# Patient Record
Sex: Female | Born: 1990 | Race: Black or African American | Hispanic: No | Marital: Single | Smoking: Never smoker
Health system: Southern US, Community
[De-identification: ages and names within clinical notes are randomized; demographics above are authoritative.]

## PROBLEM LIST (undated history)

## (undated) DIAGNOSIS — Z789 Other specified health status: Secondary | ICD-10-CM

## (undated) HISTORY — PX: NO PAST SURGERIES: SHX2092

---

## 2012-01-12 ENCOUNTER — Encounter (HOSPITAL_COMMUNITY): Payer: Self-pay

## 2012-01-12 ENCOUNTER — Emergency Department (HOSPITAL_COMMUNITY)
Admission: EM | Admit: 2012-01-12 | Discharge: 2012-01-12 | Disposition: A | Discharge status: Home / Self Care | Payer: Managed Care, Other (non HMO) | Attending: Emergency Medicine | Admitting: Emergency Medicine

## 2012-01-12 DIAGNOSIS — J029 Acute pharyngitis, unspecified: Secondary | ICD-10-CM

## 2012-01-12 LAB — RAPID STREP SCREEN (MED CTR MEBANE ONLY): Streptococcus, Group A Screen (Direct): NEGATIVE

## 2012-01-12 MED ORDER — LIDOCAINE HCL (PF) 1 % IJ SOLN
INTRAMUSCULAR | Status: AC
  Administered 2012-01-12: 2 mL
  Filled 2012-01-12: qty 5

## 2012-01-12 MED ORDER — ERYTHROMYCIN BASE 500 MG PO TABS: 500.0000 mg | ORAL_TABLET | Freq: Four times a day (QID) | ORAL | Status: DC

## 2012-01-12 MED ORDER — CEFTRIAXONE SODIUM 250 MG IJ SOLR
250.0000 mg | Freq: Once | INTRAMUSCULAR | Status: AC
  Administered 2012-01-12: 250 mg via INTRAMUSCULAR
  Filled 2012-01-12: qty 250

## 2012-01-12 NOTE — ED Notes (Signed)
sts sore throat, took pcn by ucc and no relief.

## 2012-01-12 NOTE — ED Provider Notes (Signed)
History  This chart was scribed for Ward Givens, MD by Erskine Emery. This patient was seen in room TR02C/TR02C and the patient's care was started at 15:31.   CSN: [id number]  Arrival date & time 01/12/12  1404   First MD Initiated Contact with Patient 01/12/12 1531      Chief Complaint  Patient presents with  . Sore Throat    (Consider location/radiation/quality/duration/timing/severity/associated sxs/prior treatment) HPI  Judith Craig is a 21 y.o. female who presents to the Emergency Department complaining of a mild constant sore throat. Pt reports she went to urgent care 2 weeks ago where she was tested for strep with negative findings. Pt reports she took penicillin after that visit with no relief from symptoms. She finished her PCN yesterday.  Pt denies any swelling in neck, fevers,myalgias, chills, or fatigue. Pt reports some mild dysphagia but is able to eat.   PCP none  No past medical history on file.  No past surgical history on file.  No family history on file.  History  Substance Use Topics  . Smoking status: Never Smoker   . Smokeless tobacco: Not on file  . 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   Review of Systems  Constitutional: Negative for fever, chills and fatigue.  HENT: Positive for sore throat. Negative for neck pain.   Respiratory: Negative for shortness of breath.   Gastrointestinal: Negative for nausea and vomiting.  Neurological: Negative for weakness.    Allergies  Review of patient's allergies indicates no known allergies.  Home Medications  Finished PEN VK Yesterday   BP 113/66  Pulse 85  Temp 98.4 F (36.9 C) (Oral)  Resp 18  SpO2 97% Vital signs normal   Physical Exam  Nursing note and vitals reviewed. Constitutional: She is oriented to person, place, and time. She appears well-developed and  well-nourished. No distress.  HENT:  Head: Normocephalic and atraumatic.  Mouth/Throat: Oropharyngeal exudate present.       Tonsils enlarged with erythema and some purulent exudate. No soft pallet swelling. Speech is normal.   Eyes: Conjunctivae and EOM are normal. Pupils are equal, round, and reactive to light.  Neck: Normal range of motion. Neck supple. No tracheal deviation present.       Mild lymphadenopathy on the left.   Cardiovascular: Normal rate, regular rhythm and normal heart sounds.   Pulmonary/Chest: Effort normal and breath sounds normal. No respiratory distress.  Musculoskeletal: Normal range of motion.       No epitrochlear nodes.   Lymphadenopathy:    She has cervical adenopathy.  Neurological: She is alert and oriented to person, place, and time.  Skin: Skin is warm and dry.  Psychiatric: She has a normal mood and affect. Her behavior is normal.    ED Course  Procedures (including critical care time)   Medications  cefTRIAXone (ROCEPHIN) injection 250 mg (not administered)  lidocaine (XYLOCAINE) 1 % injection (not administered)   Pt states the last time she performed oral sex was about 3 weeks ago.  She will be treated for  ? GC or chlymydia as etiology of her sore throat.   DIAGNOSTIC STUDIES: Oxygen Saturation is 97% on room air, adequate by my interpretation.    COORDINATION OF CARE: 15:30--I discussed treatment  plan including with pt and pt agreed.   Results for orders placed during the hospital encounter of 01/12/12  RAPID STREP SCREEN      Component Value Range   Streptococcus, Group A Screen (Direct) NEGATIVE  NEGATIVE  MONONUCLEOSIS SCREEN      Component Value Range   Mono Screen NEGATIVE  NEGATIVE     1. Pharyngitis     New Prescriptions   ERYTHROMYCIN BASE (E-MYCIN) 500 MG TABLET    Take 1 tablet (500 mg total) by mouth 4 (four) times daily.    Plan discharge  Devoria Albe, MD, FACEP   MDM   I personally performed the services  described in this documentation, which was scribed in my presence. The recorded information has been reviewed and considered.  Devoria Albe, MD, Armando Gang          Ward Givens, MD 01/12/12 2040

## 2012-01-14 ENCOUNTER — Encounter (HOSPITAL_COMMUNITY): Payer: Self-pay | Admitting: Physical Medicine and Rehabilitation

## 2012-01-14 ENCOUNTER — Emergency Department (HOSPITAL_COMMUNITY)
Admission: EM | Admit: 2012-01-14 | Discharge: 2012-01-14 | Disposition: A | Discharge status: Home / Self Care | Payer: Managed Care, Other (non HMO) | Attending: Emergency Medicine | Admitting: Emergency Medicine

## 2012-01-14 DIAGNOSIS — R110 Nausea: Secondary | ICD-10-CM | POA: Insufficient documentation

## 2012-01-14 NOTE — ED Notes (Signed)
Pt presents to department for evaluation of possible medication reaction. Pt states she was seen here on 01/12/12 and prescribed erythromycin. Now states she is very nauseated after taking medication. No other complaints at the time. She is alert and oriented x4. No signs of distress noted.

## 2012-01-15 ENCOUNTER — Emergency Department (INDEPENDENT_AMBULATORY_CARE_PROVIDER_SITE_OTHER)
Admission: EM | Admit: 2012-01-15 | Discharge: 2012-01-15 | Disposition: A | Discharge status: Home / Self Care | Payer: PRIVATE HEALTH INSURANCE | Source: Home / Self Care | Attending: Emergency Medicine | Admitting: Emergency Medicine

## 2012-01-15 ENCOUNTER — Encounter (HOSPITAL_COMMUNITY): Payer: Self-pay

## 2012-01-15 DIAGNOSIS — J039 Acute tonsillitis, unspecified: Secondary | ICD-10-CM

## 2012-01-15 MED ORDER — DOXYCYCLINE HYCLATE 100 MG PO CAPS: 100.0000 mg | ORAL_CAPSULE | Freq: Two times a day (BID) | ORAL | Status: AC

## 2012-01-15 NOTE — ED Provider Notes (Signed)
History     CSN: [id number]  Arrival date & time 01/15/12  1342   First MD Initiated Contact with Patient 01/15/12 1658      Chief Complaint  Patient presents with  . Medication Reaction    (Consider location/radiation/quality/duration/timing/severity/associated sxs/prior treatment) HPI Comments: This is patient's third visit related to long: Tonsillitis. Initially she was seen in [facility] as her description she was screened for strep in her test results yielded negative results, provider still decided to treat her with a penicillin course. After 10 days patient was not fully back to normal as she was still expressing a sore throat and discomfort with swelling and redness of her throat. Patient then went to our emergency department where she was screened also for mononucleosis and a second strep test was done which yielded negative results. She was treated and started on erythromycin. Which she has been taking with no improvement of her sore throat but it's also making her feel nauseous. Patient describes discomfort with swallowing foods or foods but is able to swallow without difficulty and has not been expressing any fevers, generalized malaise, body aches, nausea or vomiting or abdominal pain.  The history is provided by the patient.    History reviewed. No pertinent past medical history.  History reviewed. No pertinent past surgical history.  History reviewed. No pertinent family history.  History  Substance Use Topics  . Smoking status: Never Smoker   . Smokeless tobacco: Not on file  . Alcohol Use: Yes     social    OB History    Grav Para Term Preterm Abortions TAB SAB Ect Mult Living                  Review of Systems  Constitutional: Negative for fever, chills, appetite change and fatigue.  HENT: Positive for sore throat. Negative for facial swelling, trouble swallowing, neck pain, neck stiffness, dental problem, voice change and sinus pressure.     Respiratory: Negative for cough, shortness of breath and wheezing.   Genitourinary: Negative for dysuria.  Musculoskeletal: Negative for myalgias, joint swelling and arthralgias.  Skin: Negative for rash and wound.    Allergies  Review of patient's allergies indicates no known allergies.  Home Medications   Current Outpatient Rx  Name Route Sig Dispense Refill  . DOXYCYCLINE HYCLATE 100 MG PO CAPS Oral Take 1 capsule (100 mg total) by mouth 2 (two) times daily. 20 capsule 0    BP 114/64  Pulse 64  Temp 98.3 F (36.8 C) (Oral)  Resp 16  SpO2 100%  LMP 12/24/2011  Physical Exam  Nursing note and vitals reviewed. Constitutional: She appears well-developed and well-nourished.  HENT:  Head: Normocephalic.  Mouth/Throat: Uvula is midline and mucous membranes are normal. Oropharyngeal exudate and posterior oropharyngeal erythema present. No tonsillar abscesses.  Eyes: Conjunctivae are normal.  Neck: Normal range of motion. Neck supple. No JVD present.  Pulmonary/Chest: Effort normal and breath sounds normal.  Abdominal: Soft.  Lymphadenopathy:    She has cervical adenopathy.  Skin: No rash noted. No erythema.    ED Course  Procedures (including critical care time)       MDM  Ongoing exudative tonsillitis. Today to obtain a sample for a throat culture. DNA probe. Patient had been screened for strep, and mononucleosis which both test results yielded negative results. Patient is afebrile without signs of a pharyngeal or peritonsillar abscess. As patient is experiencing nausea and gastritis secondary to erythromycin. She has also  completed a penicillin course prescribed to her by another urgent care prior to her last ED visit. Will change antibiotic class doxycycline, have encouraged patient to take this tablets twice a day with foods. No significant improvement have been instructed to return in 3 days for recheck. She agreed to a photograph of her tonsils for record keeping  the comparison. Patient otherwise feels fine he is in no distress mild discomfort with swallowing and eating and drinking fluids fine        Jimmie Molly, MD 01/15/12 1742

## 2012-01-15 NOTE — ED Notes (Signed)
Pt c/o adverse effects from Rx Erythromycin- nausea. Rx from [facility] on 7/19 for sore throat. Pt states that Erythromycin is relieving sym but causing nausea. Prior to this Rx she had course of Penicillin which did not relieve her sym.

## 2012-01-16 LAB — STREP A DNA PROBE

## 2012-02-06 ENCOUNTER — Encounter (HOSPITAL_COMMUNITY): Payer: Self-pay | Admitting: Emergency Medicine

## 2012-02-06 ENCOUNTER — Emergency Department (HOSPITAL_COMMUNITY)
Admission: EM | Admit: 2012-02-06 | Discharge: 2012-02-06 | Disposition: A | Discharge status: Home / Self Care | Payer: Managed Care, Other (non HMO) | Attending: Emergency Medicine | Admitting: Emergency Medicine

## 2012-02-06 DIAGNOSIS — B3731 Acute candidiasis of vulva and vagina: Secondary | ICD-10-CM | POA: Insufficient documentation

## 2012-02-06 DIAGNOSIS — B373 Candidiasis of vulva and vagina: Secondary | ICD-10-CM

## 2012-02-06 LAB — WET PREP, GENITAL: Trich, Wet Prep: NONE SEEN

## 2012-02-06 LAB — GC/CHLAMYDIA PROBE AMP, GENITAL: GC Probe Amp, Genital: NEGATIVE

## 2012-02-06 MED ORDER — FLUCONAZOLE 150 MG PO TABS
150.0000 mg | ORAL_TABLET | Freq: Once | ORAL | Status: AC
  Administered 2012-02-06: 150 mg via ORAL
  Filled 2012-02-06: qty 1

## 2012-02-06 NOTE — ED Notes (Signed)
Pt states her labia is swollen on the right side  Denies injury  Denies any change of soap, shaving cream, etc.

## 2012-02-06 NOTE — ED Provider Notes (Signed)
History     CSN: [id number]  Arrival date & time 02/06/12  0245   First MD Initiated Contact with Patient 02/06/12 [phone]      Chief Complaint  Patient presents with  . Groin Swelling    (Consider location/radiation/quality/duration/timing/severity/associated sxs/prior treatment) HPI 21 yo female presents to the ER with c/o right labial swelling.  Pt denies pain, itching.  No prior h/o same.  No new sexual partners, no discharge.  Pt reports placing acidophilus tablet in her vagina 3 days ago.    History reviewed. No pertinent past medical history.  History reviewed. No pertinent past surgical history.  Family History  Problem Relation Age of Onset  . Hypertension Other   . Diabetes Other     History  Substance Use Topics  . Smoking status: Never Smoker   . Smokeless tobacco: Not on file  . Alcohol Use: Yes     social    OB History    Grav Para Term Preterm Abortions TAB SAB Ect Mult Living                  Review of Systems  All other systems reviewed and are negative.    Allergies  Review of patient's allergies indicates no known allergies.  Home Medications   Current Outpatient Rx  Name Route Sig Dispense Refill  . RISAQUAD PO CAPS Oral Take 1 capsule by mouth daily.    Marland Kitchen FLUCONAZOLE 150 MG PO TABS Oral Take 150 mg by mouth once.      BP 107/67  Pulse 79  Temp 98.5 F (36.9 C) (Oral)  Resp 20  SpO2 100%  LMP 12/24/2011  Physical Exam  Nursing note and vitals reviewed. Constitutional: She appears well-developed and well-nourished. No distress.  HENT:  Head: Normocephalic and atraumatic.  Neck: Normal range of motion. Neck supple. No JVD present. No tracheal deviation present. No thyromegaly present.  Cardiovascular: Normal rate, regular rhythm, normal heart sounds and intact distal pulses.  Exam reveals no gallop and no friction rub.   No murmur heard. Pulmonary/Chest: Effort normal and breath sounds normal. No stridor. No respiratory  distress. She has no wheezes. She has no rales. She exhibits no tenderness.  Abdominal: Soft. Bowel sounds are normal. She exhibits no distension and no mass. There is no tenderness. There is no rebound and no guarding.  Genitourinary:       Thick white discharge noted.  Labia normal, right slightly larger than left, no abscess, swelling induration noted  Musculoskeletal: Normal range of motion. She exhibits no edema and no tenderness.  Lymphadenopathy:    She has no cervical adenopathy.  Skin: Skin is warm and dry. No rash noted. She is not diaphoretic. No erythema. No pallor.    ED Course  Procedures (including critical care time)  Labs Reviewed  WET PREP, GENITAL - Abnormal; Notable for the following:    Yeast Wet Prep HPF POC FEW (*)     Clue Cells Wet Prep HPF POC RARE (*)     WBC, Wet Prep HPF POC TOO NUMEROUS TO COUNT (*)     All other components within normal limits  GC/CHLAMYDIA PROBE AMP, GENITAL    1. Vaginal yeast infection       MDM  21 yo female with reported labial swelling, no acute infection or inflammation at this time.  Yeast infection noted.  Will treat with diflucan        Olivia Mackie, MD 02/07/12 [phone]

## 2014-09-04 ENCOUNTER — Ambulatory Visit (INDEPENDENT_AMBULATORY_CARE_PROVIDER_SITE_OTHER): Payer: 59 | Admitting: Physician Assistant

## 2014-09-04 VITALS — BP 120/80 | HR 89 | Temp 98.1°F | Resp 16 | Ht 64.5 in | Wt 137.0 lb

## 2014-09-04 DIAGNOSIS — Z111 Encounter for screening for respiratory tuberculosis: Secondary | ICD-10-CM | POA: Diagnosis not present

## 2014-09-04 DIAGNOSIS — Z23 Encounter for immunization: Secondary | ICD-10-CM

## 2014-09-04 NOTE — Progress Notes (Signed)
   09/04/2014 at 5:59 PM  Newman Pies / DOB: May 11, 1991 / MRN: [id number]  The patient  does not have a problem list on file.  SUBJECTIVE  Chief compalaint: Immunizations   History of present illness: Ms. Hausner is 24 y.o. well appearing female presenting for a hep B booster, and for a Tb gold assay.  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    She  has no past medical history on file.    She has a current medication list which includes the following prescription(s): acidophilus.  Ms. Cifuentes has No Known Allergies. She  reports that she has never smoked. She does not have any smokeless tobacco history on file. She reports that she drinks alcohol. She reports that she does not use illicit drugs. She  reports that she currently engages in sexual activity. She reports using the following method of birth control/protection: None. The patient  has no past surgical history on file.  Her family history includes Diabetes in her other and paternal grandmother; Hypertension in her other.  Review of Systems  Constitutional: Negative.     OBJECTIVE  Her  height is 5' 4.5" (1.638 m) and weight is 137 lb (62.143 kg). Her oral temperature is 98.1 F (36.7 C). Her blood pressure is 120/80 and her pulse is 89. Her respiration is 16 and oxygen saturation is 100%.  The patient's body mass index is 23.16 kg/(m^2).  Physical Exam  Constitutional: She appears well-developed and well-nourished.    No results found for this or any previous visit (from the past 24 hour(s)).  ASSESSMENT & PLAN  Elza was seen today for immunizations.  Diagnoses and all orders for this visit:  Need for vaccination against hepatitis B virus Orders: -     Hepatitis B vaccine adult IM  Tuberculosis screening Orders: -     Quantiferon tb gold assay (blood)   The patient was advised to call or come back to clinic if she does not see an improvement in symptoms, or worsens with the above  plan.   Philis Fendt, MHS, PA-C [facility] 09/04/2014 5:59 PM

## 2014-09-09 LAB — QUANTIFERON TB GOLD ASSAY (BLOOD)
Interferon Gamma Release Assay: NEGATIVE
Mitogen value: >10 IU/mL
Quantiferon Nil Value: 0.06 IU/mL
Quantiferon Tb Ag Minus Nil Value: 0 IU/mL
TB AG VALUE: 0.06 [IU]/mL

## 2015-04-26 ENCOUNTER — Ambulatory Visit (INDEPENDENT_AMBULATORY_CARE_PROVIDER_SITE_OTHER): Payer: 59 | Admitting: Family Medicine

## 2015-04-26 VITALS — BP 114/70 | HR 67 | Temp 98.3°F | Resp 16 | Ht 63.5 in | Wt 135.2 lb

## 2015-04-26 DIAGNOSIS — Z0000 Encounter for general adult medical examination without abnormal findings: Secondary | ICD-10-CM

## 2015-04-26 DIAGNOSIS — Z2821 Immunization not carried out because of patient refusal: Secondary | ICD-10-CM | POA: Diagnosis not present

## 2015-04-26 NOTE — Progress Notes (Signed)
Chief Complaint:  Chief Complaint  Patient presents with  . Annual Exam    HPI: Judith Craig is a 23 y.o. female who reports to 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  She does not have her records on her. She thinks she has it from [facility]. TB questions were negative  UTD on vaccines and pap No breast or vaginal sxs Can do all the functions of being a teacher, denies any SI/HI/halluciantions    History reviewed. No pertinent past medical history. History reviewed. No pertinent past surgical history. Social History   Social History  . Marital Status: Single    Spouse Name: N/A  . Number of Children: N/A  . Years of Education: N/A   Occupational History  . TEACHER    Social History Main Topics  . Smoking status: Never Smoker   . Smokeless tobacco: None  . Alcohol Use: No     Comment: social  . Drug Use: No  . Sexual Activity: Yes    Birth Control/ Protection: None   Other Topics Concern  . None   Social History Narrative   Family History  Problem Relation Age of Onset  . Hypertension Other   . Diabetes Other   . Diabetes Paternal Grandmother    No Known Allergies Prior to Admission medications   Medication Sig Start Date End Date Taking? Authorizing Provider  acidophilus (RISAQUAD) CAPS Take 1 capsule by mouth daily.    Historical Provider, MD     ROS: The patient denies fevers, chills, night sweats, unintentional weight loss, chest pain, palpitations, wheezing, dyspnea on exertion, nausea, vomiting, abdominal pain, dysuria, hematuria, melena, numbness, weakness, or tingling.   All other systems have been reviewed and were otherwise negative with the exception of those mentioned in the HPI and as above.    PHYSICAL EXAM: Filed Vitals:   04/26/15 1813  BP: 114/70  Pulse: 67  Temp: 98.3 F (36.8 C)  Resp: 16   Body mass index is  23.58 kg/(m^2).   General: Alert, no acute distress HEENT:  Normocephalic, atraumatic, oropharynx patent. EOMI, PERRLA, no thyroidmegaly Cardiovascular:  Regular rate and rhythm, no rubs murmurs or gallops.  No Carotid bruits, radial pulse intact. No pedal edema.  Respiratory: Clear to auscultation bilaterally.  No wheezes, rales, or rhonchi.  No cyanosis, no use of accessory musculature Abdominal: No organomegaly, abdomen is soft and non-tender, positive bowel sounds. No masses. Skin: No rashes. Neurologic: Facial musculature symmetric. Psychiatric: Patient acts appropriately throughout our interaction. Lymphatic: No cervical or submandibular lymphadenopathy Musculoskeletal: Gait intact. No edema, tenderness Neg scoliosis, 5/5 strength UE and Shellee Streng   LABS: Results for orders placed or performed in visit on 09/04/14  Quantiferon tb gold assay (blood)  Result Value Ref Range   Interferon Gamma Release Assay NEGATIVE NEGATIVE   TB Ag value 0.06 IU/mL   Quantiferon Nil Value 0.06 IU/mL   Mitogen value >10.00 IU/mL   Quantiferon Tb Ag Minus Nil Value 0.00 IU/mL     EKG/XRAY:   Primary read interpreted by Dr. Marin Comment at [facility].   ASSESSMENT/PLAN: Encounter Diagnoses  Name Primary?  . Annual physical exam Yes  . Influenza vaccination declined    Nectar vaccine DB pulled, she is utd on her vaccines  She was given her forms She declines flu vaccine and nay labs, recent pap was in January and  was normal Fu prn   Gross sideeffects, risk and benefits, and alternatives of medications d/w patient. Patient is aware that all medications have potential sideeffects and we are unable to predict every sideeffect or drug-drug interaction that may occur.  Umeka Wrench DO  04/27/2015 1:14 PM

## 2019-04-18 ENCOUNTER — Other Ambulatory Visit: Payer: Self-pay

## 2019-04-18 ENCOUNTER — Encounter (HOSPITAL_COMMUNITY): Payer: Self-pay | Admitting: Emergency Medicine

## 2019-04-18 ENCOUNTER — Emergency Department (HOSPITAL_COMMUNITY)
Admission: EM | Admit: 2019-04-18 | Discharge: 2019-04-19 | Disposition: A | Discharge status: Home / Self Care | Attending: Emergency Medicine | Admitting: Emergency Medicine

## 2019-04-18 DIAGNOSIS — Z79899 Other long term (current) drug therapy: Secondary | ICD-10-CM | POA: Diagnosis not present

## 2019-04-18 DIAGNOSIS — N839 Noninflammatory disorder of ovary, fallopian tube and broad ligament, unspecified: Secondary | ICD-10-CM | POA: Insufficient documentation

## 2019-04-18 DIAGNOSIS — R140 Abdominal distension (gaseous): Secondary | ICD-10-CM | POA: Diagnosis present

## 2019-04-18 DIAGNOSIS — N838 Other noninflammatory disorders of ovary, fallopian tube and broad ligament: Secondary | ICD-10-CM

## 2019-04-18 LAB — URINALYSIS, ROUTINE W REFLEX MICROSCOPIC
Bilirubin Urine: NEGATIVE
Glucose, UA: NEGATIVE mg/dL
Ketones, ur: NEGATIVE mg/dL
Nitrite: NEGATIVE
Protein, ur: NEGATIVE mg/dL
Specific Gravity, Urine: 1.019 (ref 1.005–1.030)
pH: 5 (ref 5.0–8.0)

## 2019-04-18 LAB — I-STAT BETA HCG BLOOD, ED (MC, WL, AP ONLY): I-stat hCG, quantitative: <5 m[IU]/mL (ref <5)

## 2019-04-18 LAB — COMPREHENSIVE METABOLIC PANEL
ALT: 22 U/L (ref 0–44)
AST: 27 U/L (ref 15–41)
Albumin: 3.6 g/dL (ref 3.5–5.0)
Alkaline Phosphatase: 59 U/L (ref 38–126)
Anion gap: 13 (ref 5–15)
BUN: 5 mg/dL — ABNORMAL LOW (ref 6–20)
CO2: 22 mmol/L (ref 22–32)
Calcium: 9.5 mg/dL (ref 8.9–10.3)
Chloride: 104 mmol/L (ref 98–111)
Creatinine, Ser: 0.79 mg/dL (ref 0.44–1.00)
GFR calc Af Amer: >60 mL/min (ref >60)
GFR calc non Af Amer: >60 mL/min (ref >60)
Glucose, Bld: 81 mg/dL (ref 70–99)
Potassium: 4.2 mmol/L (ref 3.5–5.1)
Sodium: 139 mmol/L (ref 135–145)
Total Bilirubin: 0.6 mg/dL (ref 0.3–1.2)
Total Protein: 7.1 g/dL (ref 6.5–8.1)

## 2019-04-18 LAB — CBC
HCT: 41.9 % (ref 36.0–46.0)
Hemoglobin: 13.7 g/dL (ref 12.0–15.0)
MCH: 30.5 pg (ref 26.0–34.0)
MCHC: 32.7 g/dL (ref 30.0–36.0)
MCV: 93.3 fL (ref 80.0–100.0)
Platelets: 450 10*3/uL — ABNORMAL HIGH (ref 150–400)
RBC: 4.49 MIL/uL (ref 3.87–5.11)
RDW: 12.7 % (ref 11.5–15.5)
WBC: 6.6 10*3/uL (ref 4.0–10.5)
nRBC: 0 % (ref 0.0–0.2)

## 2019-04-18 LAB — LIPASE, BLOOD: Lipase: 24 U/L (ref 11–51)

## 2019-04-18 MED ORDER — SODIUM CHLORIDE 0.9% FLUSH: 3.0000 mL | Freq: Once | INTRAVENOUS | Status: DC

## 2019-04-18 NOTE — ED Triage Notes (Signed)
Pt c/o abd pain and distention. Pt seen at [facility] for same, given meds for constipation, no relief. Last BM 8 days ago. Denies n/v

## 2019-04-19 ENCOUNTER — Emergency Department (HOSPITAL_COMMUNITY)

## 2019-04-19 MED ORDER — IOHEXOL 300 MG/ML  SOLN
100.0000 mL | Freq: Once | INTRAMUSCULAR | Status: AC | PRN
  Administered 2019-04-19: 04:00:00 100 mL via INTRAVENOUS

## 2019-04-19 NOTE — Discharge Instructions (Addendum)
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  You will need to have surgery.  The surgery is to take place urgently.  You should NOT travel, relocate or deploy prior to having surgery.

## 2019-04-19 NOTE — ED Provider Notes (Signed)
[facility] EMERGENCY DEPARTMENT Provider Note   CSN: [id number] Arrival date & time: 04/18/19  1919     History   Chief Complaint Chief Complaint  Patient presents with  . Abdominal Pain    HPI Judith Craig is a 28 y.o. female.     Patient presents to the emergency department with a chief complaint of abdominal distention and constipation.  She states that her belly has become very distended over the past couple of weeks.  She denies having any recent bowel movements.  States that her last bowel movement was on 10/15.  She denies pain.  Denies any fevers, chills, nausea, vomiting.  She states that she has been taking MiraLAX and Colace without any success for the past 2 weeks.  The history is provided by the patient. No language interpreter was used.    History reviewed. No pertinent past medical history.  There are no active problems to display for this patient.   History reviewed. No pertinent surgical history.   OB History   No obstetric history on file.      Home Medications    Prior to Admission medications   Medication Sig Start Date End Date Taking? Authorizing Provider  acidophilus (RISAQUAD) CAPS Take 1 capsule by mouth daily.    [provider]    Family History Family History  Problem Relation Age of Onset  . Diabetes Paternal Grandmother   . Hypertension Other   . Diabetes Other     Social History Social History   Tobacco Use  . Smoking status: Never Smoker  . Smokeless tobacco: Never Used  Substance Use Topics  . Alcohol use: No    Alcohol/week: 0.0 standard drinks    Comment: social  . Drug use: No     Allergies   Patient has no known allergies.   Review of Systems Review of Systems  All other systems reviewed and are negative.    Physical Exam Updated Vital Signs BP 136/86 (BP Location: Left Arm)   Pulse 93   Temp 98.7 F (37.1 C) (Oral)   Resp 18   Ht 5\' 4"  (1.626 m)   Wt 61 kg   LMP  04/15/2019   SpO2 94%   BMI 23.08 kg/m   Physical Exam Vitals signs and nursing note reviewed.  Constitutional:      General: She is not in acute distress.    Appearance: She is well-developed.  HENT:     Head: Normocephalic and atraumatic.  Eyes:     Conjunctiva/sclera: Conjunctivae normal.  Neck:     Musculoskeletal: Neck supple.  Cardiovascular:     Rate and Rhythm: Normal rate and regular rhythm.     Heart sounds: No murmur.  Pulmonary:     Effort: Pulmonary effort is normal. No respiratory distress.     Breath sounds: Normal breath sounds.  Abdominal:     General: There is distension.     Palpations: Abdomen is soft.     Tenderness: There is no abdominal tenderness.  Musculoskeletal: Normal range of motion.  Skin:    General: Skin is warm and dry.  Neurological:     Mental Status: She is alert and oriented to person, place, and time.  Psychiatric:        Mood and Affect: Mood normal.        Behavior: Behavior normal.      ED Treatments / Results  Labs (all labs ordered are listed, but only abnormal results are  displayed) Labs Reviewed  COMPREHENSIVE METABOLIC PANEL - Abnormal; Notable for the following components:      Result Value   BUN 5 (*)    All other components within normal limits  CBC - Abnormal; Notable for the following components:   Platelets 450 (*)    All other components within normal limits  URINALYSIS, ROUTINE W REFLEX MICROSCOPIC - Abnormal; Notable for the following components:   Hgb urine dipstick LARGE (*)    Leukocytes,Ua TRACE (*)    Bacteria, UA RARE (*)    All other components within normal limits  LIPASE, BLOOD  CA 125  I-STAT BETA HCG BLOOD, ED ([facility], [facility], [facility] ONLY)    EKG None  Radiology Dg Abd Acute 2+v W 1v Chest  Result Date: 04/19/2019 CLINICAL DATA:  28 year old female with abdominal distension. EXAM: DG ABDOMEN ACUTE W/ 1V CHEST COMPARISON:  Abdominal radiograph dated 08/12/2018 FINDINGS: Minimal right lung base  linear atelectasis. No focal consolidation, pleural effusion, or pneumothorax. The cardiac silhouette is within normal limits. Nonspecific bowel gas pattern. No bowel dilatation or evidence of obstruction. Air is noted in the sigmoid colon. There is centrally located small bowel air. Ascites is not excluded. No free air identified. The osseous structures and soft tissues are grossly unremarkable. IMPRESSION: 1. No acute cardiopulmonary process. 2. No evidence of bowel obstruction. No free air. Electronically Signed   By: Anner Crete M.D.   On: 04/19/2019 03:30    Procedures Procedures (including critical care time)  Medications Ordered in ED Medications  sodium chloride flush (NS) 0.9 % injection 3 mL (has no administration in time range)     Initial Impression / Assessment and Plan / ED Course  I have reviewed the triage vital signs and the nursing notes.  Pertinent labs & imaging results that were available during my care of the patient were reviewed by me and considered in my medical decision making (see chart for details).       Patient with significant abdominal distention, no tenderness on exam, denies having bowel movements in the past 10 days.  She is concerned about constipation.  She has not had pain.  Laboratory work-up is fairly reassuring.  Plain films show no evidence of bowel obstruction or air-fluid levels.  I remain concerned about the amount of distention, and question for this whether there is some radio occult process at work.  Will check CT.   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  She also has large volume ascites.  Laboratory work-up is reassuring.  I discussed the case with Dr. Glo Herring from [facility], who recommends urgent gyn-onc follow-up.  He will notify Dr. Denman George and the gyn-onc office.  Patient also given gyn-onc contact information. Final Clinical Impressions(s) / ED Diagnoses   Final diagnoses:  Ovarian mass    ED Discharge Orders     None       Montine Circle, PA-C 04/19/19 [place], Delice Bison, DO 04/19/19 [phone]

## 2019-04-19 NOTE — ED Notes (Signed)
Patient verbalizes understanding of discharge instructions. Opportunity for questioning and answers were provided. Armband removed by staff, pt discharged from ED. Ambulated out to lobby  

## 2019-04-19 NOTE — ED Notes (Signed)
Patient transported to CT 

## 2019-04-21 ENCOUNTER — Telehealth: Payer: Self-pay | Admitting: *Deleted

## 2019-04-21 LAB — CA 125: Cancer Antigen (CA) 125: 957 U/mL — ABNORMAL HIGH (ref 0.0–38.1)

## 2019-04-21 NOTE — Telephone Encounter (Signed)
Called and scheduled the patient for a new patient appt tomorrow. Gave the date/time to patient; also gave the policy for parking, mask and visitors

## 2019-04-22 ENCOUNTER — Other Ambulatory Visit: Payer: Self-pay | Admitting: Gynecologic Oncology

## 2019-04-22 ENCOUNTER — Encounter: Payer: Self-pay | Admitting: Gynecologic Oncology

## 2019-04-22 ENCOUNTER — Inpatient Hospital Stay: Attending: Gynecologic Oncology | Admitting: Gynecologic Oncology

## 2019-04-22 ENCOUNTER — Other Ambulatory Visit: Payer: Self-pay

## 2019-04-22 VITALS — BP 116/68 | HR 84 | Temp 98.5°F | Resp 16 | Ht 64.0 in | Wt 133.0 lb

## 2019-04-22 DIAGNOSIS — D3912 Neoplasm of uncertain behavior of left ovary: Secondary | ICD-10-CM | POA: Diagnosis present

## 2019-04-22 DIAGNOSIS — N838 Other noninflammatory disorders of ovary, fallopian tube and broad ligament: Secondary | ICD-10-CM | POA: Insufficient documentation

## 2019-04-22 DIAGNOSIS — R6881 Early satiety: Secondary | ICD-10-CM | POA: Insufficient documentation

## 2019-04-22 DIAGNOSIS — R188 Other ascites: Secondary | ICD-10-CM | POA: Diagnosis not present

## 2019-04-22 DIAGNOSIS — K5900 Constipation, unspecified: Secondary | ICD-10-CM | POA: Diagnosis not present

## 2019-04-22 DIAGNOSIS — D3911 Neoplasm of uncertain behavior of right ovary: Secondary | ICD-10-CM | POA: Diagnosis present

## 2019-04-22 DIAGNOSIS — R971 Elevated cancer antigen 125 [CA 125]: Secondary | ICD-10-CM | POA: Insufficient documentation

## 2019-04-22 MED ORDER — SENNOSIDES-DOCUSATE SODIUM 8.6-50 MG PO TABS: 2.0000 | ORAL_TABLET | Freq: Every day | ORAL | 1 refills | Status: DC

## 2019-04-22 MED ORDER — OXYCODONE HCL 5 MG PO TABS: 5.0000 mg | ORAL_TABLET | ORAL | 0 refills | Status: DC | PRN

## 2019-04-22 MED ORDER — IBUPROFEN 800 MG PO TABS: 800.0000 mg | ORAL_TABLET | Freq: Three times a day (TID) | ORAL | 1 refills | Status: AC | PRN

## 2019-04-22 NOTE — Progress Notes (Signed)
Consult Note: Gyn-Onc  Consult was requested by Dr. Glo Herring for the evaluation of Judith Craig 28 y.o. female  CC:  Chief Complaint  Patient presents with  . Ovarian mass    Assessment/Plan:  [name]  is a 28 y.o.  year old with bilateral solid ovarian masses, large volume ascites and very elevated CA 125.  I reviewed her CT scan images with the patient from her sixth CT on April 19, 2019.  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  Additionally she has large volume ascites and a very elevated Ca1 25.  Reassuringly the does not appear to be bulky extraovarian disease in the upper abdomen.  I discussed with the patient that we will obtain paracentesis to evaluate for possible malignant ascites.  Additionally this will be therapeutic if she waits for her surgical date given that her distention is making it difficult for her to eat and maintain adequate nutritional status.  I explained that I am recommending surgery with hysterectomy BSO, omentectomy, radical debulking as deemed necessary by surgical findings.  I explained that this will result in permanent infertility and loss of ovarian function.  I discussed that given the appearance of her ovaries on CT scan I do not see an alternative option in which ovarian preservation would be possible.  The patient is excepting of loss of infertility.  She would prefer this route and ensure adequate debulking of malignancy rather than compromise chance of cure with fertility preservation.  I explained that surgery would involve a vertical midline incision or paramedian incision.  I explained surgical risks including  bleeding, infection, damage to internal organs (such as bladder,ureters, bowels), blood clot, reoperation and rehospitalization. I explained that she will need prolonged anticoagulant  prophylaxis for VTE risk.  I counseled her regarding optimizing nutrition preoperatively.  I discussed that she may require chemotherapy postoperatively if malignancy is confirmed.   HPI: [name] is a 28 year old P0 who is seen in consultation at the request of Dr. Glo Herring for evaluation of bilateral adnexal masses, large volume ascites, elevated CA-125.  The patient reported vague abdominal distention beginning in April 2020.  In September and October 2020 the distention became more visibly noticeable and she began having early satiety and difficulty having bowel movements with constipation.  She was seen and evaluated at the [facility], ED on October 23 and 24, 2020 at which time a CT scan of the abdomen and pelvis was performed which revealed large volume ascites, small right pleural effusion, normal liver and upper abdomen, or no lymphadenopathy.  The uterus was within normal limits.  There were bilateral heterogeneous enhancing masses, the left measuring 14.7 x 11.4 x 15.4 cm and the right measuring 11.6 x 9.8 x 11.8 cm.  There are a few additional nodular enhancing lesions within the right lower pelvis measuring 2.6 cm and 1.4 cm this was highly concerning for primary ovarian malignancy.  No frank omental caking was seen.  Ca1 25 was drawn on April 19, 2019 and was elevated at 957.  The patient reported that she is otherwise healthy and has never been hospitalized or had surgery.  She had never been pregnant.  She had regular menstrual cycles and prior to her presentation her last menstrual period was April 15, 2019.  She has no family history for malignancies gynecologic or otherwise.  The patient just  graduated from [employer] and works for [employer] and was on medical leave at the time of presentation.  Current Meds:  Outpatient Encounter Medications as of 04/22/2019  Medication Sig  . acidophilus (RISAQUAD) CAPS Take 1 capsule by mouth daily.   No  facility-administered encounter medications on file as of 04/22/2019.     Allergy: No Known Allergies  Social Hx:   Social History   Socioeconomic History  . Marital status: Single    Spouse name: Not on file  . Number of children: Not on file  . Years of education: Not on file  . Highest education level: Not on file  Occupational History  . Occupation: TEACHER  Social Needs  . Financial resource strain: Not on file  . Food insecurity    Worry: Not on file    Inability: Not on file  . Transportation needs    Medical: Not on file    Non-medical: Not on file  Tobacco Use  . Smoking status: Never Smoker  . Smokeless tobacco: Never Used  Substance and Sexual Activity  . Alcohol use: No    Alcohol/week: 0.0 standard drinks    Comment: social  . Drug use: No  . Sexual activity: Yes    Birth control/protection: None  Lifestyle  . Physical activity    Days per week: Not on file    Minutes per session: Not on file  . Stress: Not on file  Relationships  . Social Herbalist on phone: Not on file    Gets together: Not on file    Attends religious service: Not on file    Active member of club or organization: Not on file    Attends meetings of clubs or organizations: Not on file    Relationship status: Not on file  . Intimate partner violence    Fear of current or ex partner: Not on file    Emotionally abused: Not on file    Physically abused: Not on file    Forced sexual activity: Not on file  Other Topics Concern  . Not on file  Social History Narrative  . Not on file    Past Surgical Hx: History reviewed. No pertinent surgical history.  Past Medical Hx: History reviewed. No pertinent past medical history.  Past Gynecological History:  See HPI, no hx of abnormal paps, nulliparous.  Patient's last menstrual period was 04/15/2019.  Family Hx:  Family History  Problem Relation Age of Onset  . Diabetes Paternal Grandmother   . Hypertension Other   .  Diabetes Other     Review of Systems:  Constitutional  Feels somewhat fatigued  ENT Normal appearing ears and nares bilaterally Skin/Breast  No rash, sores, jaundice, itching, dryness Cardiovascular  No chest pain, shortness of breath, or edema  Pulmonary  No cough or wheeze.  Gastro Intestinal  No nausea, vomitting, or diarrhoea. No bright red blood per rectum, no abdominal pain, change in bowel movement, + constipation. + distension.  Genito Urinary  No frequency, urgency, dysuria,  Musculo Skeletal  No myalgia, arthralgia, joint swelling or pain  Neurologic  No weakness, numbness, change in gait,  Psychology  No depression, anxiety, insomnia.   Vitals:  Blood pressure 116/68, pulse 84, temperature 98.5 F (36.9 C), temperature source Oral, resp. rate 16, height 5\' 4"  (1.626 m), weight 133 lb (60.3 kg), last menstrual period 04/15/2019, SpO2 99 %.  Physical Exam: WD in NAD Neck  Supple NROM, without any  enlargements.  Lymph Node Survey No cervical supraclavicular or inguinal adenopathy Cardiovascular  Pulse normal rate, regularity and rhythm. S1 and S2 normal.  Lungs  Clear to auscultation bilateraly, without wheezes/crackles/rhonchi. Good air movement.  Skin  No rash/lesions/breakdown  Psychiatry  Alert and oriented to person, place, and time  Abdomen  Normoactive bowel sounds, abdomen soft, non-tender and thin and very distended and dull to percuss without evidence of hernia.  Back No CVA tenderness Genito Urinary  Vulva/vagina: Normal external female genitalia.   No lesions. No discharge or bleeding.  Bladder/urethra:  No lesions or masses, well supported bladder  Vagina: normal  Cervix: Normal appearing, no lesions.  Uterus:  Small, mobile, no parametrial involvement or nodularity.  Adnexa: unable to appreciate masses. Rectal  Good tone, no masses no cul de sac nodularity.  Extremities  No bilateral cyanosis, clubbing or edema.   Thereasa Solo, MD   04/22/2019, 11:35 AM

## 2019-04-22 NOTE — Patient Instructions (Addendum)
Preparing for your Surgery  Plan for surgery on May 06, 2019 with Dr. Everitt Amber at [facility] will be scheduled for a exploratory laparotomy, total abdominal hysterectomy, bilateral salpingo-oophorectomy, omentectomy, debulking.   You will need to clean out your colon before surgery with an aggressive bowel regimen. You can drink a bottle of magnesium citrate the day before surgery starting around 10 am.   Pre-operative Testing -You will receive a phone call from presurgical testing at [facility] if you have not received a call already to arrange for a pre-operative testing appointment before your surgery.  This appointment normally occurs one to two weeks before your scheduled surgery.   -Bring your insurance card, copy of an advanced directive if applicable, medication list  -At that visit, you will be asked to sign a consent for a possible blood transfusion in case a transfusion becomes necessary during surgery.  The need for a blood transfusion is rare but having consent is a necessary part of your care.     -You should not be taking blood thinners or aspirin at least ten days prior to surgery unless instructed by your surgeon.  -As part of our enhanced surgical recovery pathway, you may be advised to drink a carbohydrate drink the morning of surgery (at least 3 hours before). If you are diabetic, this will be substituted with G2 gatorade in order to prevent elevated glucose levels prior to surgery.  -Do not take supplements such as fish oil (omega 3), red yeast rice, tumeric before your surgery.  Day Before Surgery at [facility] will be asked to take in a light diet the day before surgery.  Avoid carbonated beverages.  You will be advised to have nothing to eat or drink after midnight the evening before.    Eat a light diet the day before surgery.  Examples including soups, broths, toast, yogurt, mashed potatoes.  Things to avoid include carbonated beverages  (fizzy beverages), raw fruits and raw vegetables, or beans.   If your bowels are filled with gas, your surgeon will have difficulty visualizing your pelvic organs which increases your surgical risks.  Your role in recovery Your role is to become active as soon as directed by your doctor, while still giving yourself time to heal.  Rest when you feel tired. You will be asked to do the following in order to speed your recovery:  - Cough and breathe deeply. This helps toclear and expand your lungs and can prevent pneumonia.  - Do mild physical activity. Walking or moving your legs help your circulation and body functions return to normal. A staff member will help you when you try to walk and will provide you with simple exercises. Do not try to get up or walk alone the first time. - Actively manage your pain. Managing your pain lets you move in comfort. We will ask you to rate your pain on a scale of zero to 10. It is your responsibility to tell your doctor or nurse where and how much you hurt so your pain can be treated.  Special Considerations -If you are diabetic, you may be placed on insulin after surgery to have closer control over your blood sugars to promote healing and recovery.  This does not mean that you will be discharged on insulin.  If applicable, your oral antidiabetics will be resumed when you are tolerating a solid diet.  -Your final pathology results from surgery should be available around one week after surgery  and the results will be relayed to you when available.  -Dr. Lahoma Crocker is the surgeon that assists your GYN Oncologist with surgery.  If you end up staying the night, the next day after your surgery you will either see Dr. Denman George or Dr. Lahoma Crocker.  -FMLA forms can be faxed to [phone] and please allow 5-7 business days for completion.  Pain Management After Surgery -You have been prescribed your pain medication and bowel regimen medications before  surgery so that you can have these available when you are discharged from the hospital. The pain medication is for use ONLY AFTER surgery and a new prescription will not be given.   -Make sure that you have Tylenol and Ibuprofen at home to use on a regular basis after surgery for pain control. We recommend alternating the medications every hour to six hours since they work differently and are processed in the body differently for pain relief.  -Review the attached handout on narcotic use and their risks and side effects.   Bowel Regimen -You have been prescribed Sennakot-S to take nightly to prevent constipation especially if you are taking the narcotic pain medication intermittently.  It is important to prevent constipation and drink adequate amounts of liquids.  Blood Transfusion Information WHAT IS A BLOOD TRANSFUSION? A transfusion is the replacement of blood or some of its parts. Blood is made up of multiple cells which provide different functions.  Red blood cells carry oxygen and are used for blood loss replacement.  White blood cells fight against infection.  Platelets control bleeding.  Plasma helps clot blood.  Other blood products are available for specialized needs, such as hemophilia or other clotting disorders. BEFORE THE TRANSFUSION  Who gives blood for transfusions?   You may be able to donate blood to be used at a later date on yourself (autologous donation).  Relatives can be asked to donate blood. This is generally not any safer than if you have received blood from a stranger. The same precautions are taken to ensure safety when a relative's blood is donated.  Healthy volunteers who are fully evaluated to make sure their blood is safe. This is blood bank blood. Transfusion therapy is the safest it has ever been in the practice of medicine. Before blood is taken from a donor, a complete history is taken to make sure that person has no history of diseases nor engages in  risky social behavior (examples are intravenous drug use or sexual activity with multiple partners). The donor's travel history is screened to minimize risk of transmitting infections, such as malaria. The donated blood is tested for signs of infectious diseases, such as HIV and hepatitis. The blood is then tested to be sure it is compatible with you in order to minimize the chance of a transfusion reaction. If you or a relative donates blood, this is often done in anticipation of surgery and is not appropriate for emergency situations. It takes many days to process the donated blood. RISKS AND COMPLICATIONS Although transfusion therapy is very safe and saves many lives, the main dangers of transfusion include:   Getting an infectious disease.  Developing a transfusion reaction. This is an allergic reaction to something in the blood you were given. Every precaution is taken to prevent this. The decision to have a blood transfusion has been considered carefully by your caregiver before blood is given. Blood is not given unless the benefits outweigh the risks.  AFTER SURGERY INSTRUCTIONS  04/22/2019  Return to  work: 4-6 weeks if applicable  Activity: 1. Be up and out of the bed during the day.  Take a nap if needed.  You may walk up steps but be careful and use the hand rail.  Stair climbing will tire you more than you think, you may need to stop part way and rest.   2. No lifting or straining for 6 weeks.  3. No driving for 2 week(s).  Do not drive if you are taking narcotic pain medicine.  4. Shower daily.  Use soap and water on your incision and pat dry; don't rub.  No tub baths until cleared by your surgeon.   5. No sexual activity and nothing in the vagina for 6 weeks.  6. You may experience a small amount of clear drainage from your incision, which is normal.  If the drainage persists or increases, please call the office.  7. You may experience vaginal spotting after surgery or around  the 6-8 week mark from surgery when the stitches at the top of the vagina begin to dissolve.  The spotting is normal but if you experience heavy bleeding, call our office.  8. Take Tylenol or ibuprofen first for pain and only use narcotic pain medication for severe pain not relieved by the Tylenol or Ibuprofen.  Monitor your Tylenol intake to a max of 4,000 mg.  Diet: 1. Low sodium Heart Healthy Diet is recommended.  2. It is safe to use a laxative, such as Miralax or Colace, if you have difficulty moving your bowels. You can take Sennakot at bedtime every evening to keep bowel movements regular and to prevent constipation.    Wound Care: 1. Keep clean and dry.  Shower daily.  Reasons to call the Doctor:  Fever - Oral temperature greater than 100.4 degrees Fahrenheit  Foul-smelling vaginal discharge  Difficulty urinating  Nausea and vomiting  Increased pain at the site of the incision that is unrelieved with pain medicine.  Difficulty breathing with or without chest pain  New calf pain especially if only on one side  Sudden, continuing increased vaginal bleeding with or without clots.   Contacts: For questions or concerns you should contact:  Dr. Everitt Amber at [phone]  Joylene John, NP at [phone]  After Hours: call [phone] and have the GYN Oncologist paged/contacted  Enoxaparin injection What is this medicine? ENOXAPARIN (ee nox a PA rin) is used after knee, hip, or abdominal surgeries to prevent blood clotting. It is also used to treat existing blood clots in the lungs or in the veins. This medicine may be used for other purposes; ask your health care provider or pharmacist if you have questions. COMMON BRAND NAME(S): Lovenox What should I tell my health care provider before I take this medicine? They need to know if you have any of these conditions:  bleeding disorders, hemorrhage, or hemophilia  infection of the heart or heart valves  kidney or  liver disease  previous stroke  prosthetic heart valve  recent surgery or delivery of a baby  ulcer in the stomach or intestine, diverticulitis, or other bowel disease  an unusual or allergic reaction to enoxaparin, heparin, pork or pork products, other medicines, foods, dyes, or preservatives  pregnant or trying to get pregnant  breast-feeding How should I use this medicine? This medicine is for injection under the skin. It is usually given by a health-care professional. You or a family member may be trained on how to give the injections. If you are to  give yourself injections, make sure you understand how to use the syringe, measure the dose if necessary, and give the injection. To avoid bruising, do not rub the site where this medicine has been injected. Do not take your medicine more often than directed. Do not stop taking except on the advice of your doctor or health care professional. Make sure you receive a puncture-resistant container to dispose of the needles and syringes once you have finished with them. Do not reuse these items. Return the container to your doctor or health care professional for proper disposal. Talk to your pediatrician regarding the use of this medicine in children. Special care may be needed. Overdosage: If you think you have taken too much of this medicine contact a poison control center or emergency room at once. NOTE: This medicine is only for you. Do not share this medicine with others. What if I miss a dose? If you miss a dose, take it as soon as you can. If it is almost time for your next dose, take only that dose. Do not take double or extra doses. What may interact with this medicine?  aspirin and aspirin-like medicines  certain medicines that treat or prevent blood clots  dipyridamole  NSAIDs, medicines for pain and inflammation, like ibuprofen or naproxen This list may not describe all possible interactions. Give your health care provider a  list of all the medicines, herbs, non-prescription drugs, or dietary supplements you use. Also tell them if you smoke, drink alcohol, or use illegal drugs. Some items may interact with your medicine. What should I watch for while using this medicine? Visit your healthcare professional for regular checks on your progress. You may need blood work done while you are taking this medicine. Your condition will be monitored carefully while you are receiving this medicine. It is important not to miss any appointments. If you are going to need surgery or other procedure, tell your healthcare professional that you are using this medicine. Using this medicine for a long time may weaken your bones and increase the risk of bone fractures. Avoid sports and activities that might cause injury while you are using this medicine. Severe falls or injuries can cause unseen bleeding. Be careful when using sharp tools or knives. Consider using an Copy. Take special care brushing or flossing your teeth. Report any injuries, bruising, or red spots on the skin to your healthcare professional. Wear a medical ID bracelet or chain. Carry a card that describes your disease and details of your medicine and dosage times. What side effects may I notice from receiving this medicine? Side effects that you should report to your doctor or health care professional as soon as possible:  allergic reactions like skin rash, itching or hives, swelling of the face, lips, or tongue  bone pain  signs and symptoms of bleeding such as bloody or black, tarry stools; red or dark-brown urine; spitting up blood or brown material that looks like coffee grounds; red spots on the skin; unusual bruising or bleeding from the eye, gums, or nose  signs and symptoms of a blood clot such as chest pain; shortness of breath; pain, swelling, or warmth in the leg  signs and symptoms of a stroke such as changes in vision; confusion; trouble speaking or  understanding; severe headaches; sudden numbness or weakness of the face, arm or leg; trouble walking; dizziness; loss of coordination Side effects that usually do not require medical attention (report to your doctor or health care professional  if they continue or are bothersome):  hair loss  pain, redness, or irritation at site where injected This list may not describe all possible side effects. Call your doctor for medical advice about side effects. You may report side effects to FDA at 1-800-FDA-1088. Where should I keep my medicine? Keep out of the reach of children. Store at room temperature between 15 and 30 degrees C (59 and 86 degrees F). Do not freeze. If your injections have been specially prepared, you may need to store them in the refrigerator. Ask your pharmacist. Throw away any unused medicine after the expiration date. NOTE: This sheet is a summary. It may not cover all possible information. If you have questions about this medicine, talk to your doctor, pharmacist, or health care provider.  2020 Elsevier/Gold Standard (2017-06-07 11:25:34)   Paracentesis, Care After This sheet gives you information about how to care for yourself after your procedure. Your health care provider may also give you more specific instructions. If you have problems or questions, contact your health care provider. What can I expect after the procedure? After the procedure, it is common to have a small amount of clear fluid coming from the puncture site. Follow these instructions at home: Puncture site care   Follow instructions from your health care provider about how to take care of your puncture site. Make sure you: ? Wash your hands with soap and water before and after you change your bandage (dressing). If soap and water are not available, use hand sanitizer. ? Change your dressing as told by your health care provider.  Check your puncture area every day signs of infection. Check  for: ? Redness, swelling, or pain. ? More fluid or blood. ? Warmth. ? Pus or a bad smell. General instructions  Return to your normal activities as told by your health care provider. Ask your health care provider what activities are safe for you.  Take over-the-counter and prescription medicines only as told by your health care provider.  Do not take baths, swim, or use a hot tub until your health care provider approves. Ask your health care provider if you may take showers. You may only be allowed to take sponge baths.  Keep all follow-up visits as told by your health care provider. This is important. Contact a health care provider if:  You have redness, swelling, or pain at your puncture site.  You have more fluid or blood coming from your puncture site.  Your puncture site feels warm to the touch.  You have pus or a bad smell coming from your puncture site.  You have a fever. Get help right away if:  You have chest pain or shortness of breath.  You develop increasing pain, discomfort, or swelling in your abdomen.  You feel dizzy or light-headed or you faint. Summary  After the procedure, it is common to have a small amount of clear fluid coming from the puncture site.  Follow instructions from your health care provider about how to take care of your puncture site.  Check your puncture area every day signs of infection.  Keep all follow-up visits as told by your health care provider. This information is not intended to replace advice given to you by your health care provider. Make sure you discuss any questions you have with your health care provider. Document Released: 10/27/2014 Document Revised: 05/14/2018 Document Reviewed: 04/02/2018 Elsevier Patient Education  2020 [employer].

## 2019-04-22 NOTE — H&P (View-Only) (Signed)
Consult Note: Gyn-Onc  Consult was requested by Dr. Glo Herring for the evaluation of Judith Craig 28 y.o. female  CC:  Chief Complaint  Patient presents with  . Ovarian mass    Assessment/Plan:  [name]  is a 28 y.o.  year old with bilateral solid ovarian masses, large volume ascites and very elevated CA 125.  I reviewed her CT scan images with the patient from her sixth CT on April 19, 2019.  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  Additionally she has large volume ascites and a very elevated Ca1 25.  Reassuringly the does not appear to be bulky extraovarian disease in the upper abdomen.  I discussed with the patient that we will obtain paracentesis to evaluate for possible malignant ascites.  Additionally this will be therapeutic if she waits for her surgical date given that her distention is making it difficult for her to eat and maintain adequate nutritional status.  I explained that I am recommending surgery with hysterectomy BSO, omentectomy, radical debulking as deemed necessary by surgical findings.  I explained that this will result in permanent infertility and loss of ovarian function.  I discussed that given the appearance of her ovaries on CT scan I do not see an alternative option in which ovarian preservation would be possible.  The patient is excepting of loss of infertility.  She would prefer this route and ensure adequate debulking of malignancy rather than compromise chance of cure with fertility preservation.  I explained that surgery would involve a vertical midline incision or paramedian incision.  I explained surgical risks including  bleeding, infection, damage to internal organs (such as bladder,ureters, bowels), blood clot, reoperation and rehospitalization. I explained that she will need prolonged anticoagulant  prophylaxis for VTE risk.  I counseled her regarding optimizing nutrition preoperatively.  I discussed that she may require chemotherapy postoperatively if malignancy is confirmed.   HPI: [name] is a 28 year old P0 who is seen in consultation at the request of Dr. Glo Herring for evaluation of bilateral adnexal masses, large volume ascites, elevated CA-125.  The patient reported vague abdominal distention beginning in April 2020.  In September and October 2020 the distention became more visibly noticeable and she began having early satiety and difficulty having bowel movements with constipation.  She was seen and evaluated at the [facility], ED on October 23 and 24, 2020 at which time a CT scan of the abdomen and pelvis was performed which revealed large volume ascites, small right pleural effusion, normal liver and upper abdomen, or no lymphadenopathy.  The uterus was within normal limits.  There were bilateral heterogeneous enhancing masses, the left measuring 14.7 x 11.4 x 15.4 cm and the right measuring 11.6 x 9.8 x 11.8 cm.  There are a few additional nodular enhancing lesions within the right lower pelvis measuring 2.6 cm and 1.4 cm this was highly concerning for primary ovarian malignancy.  No frank omental caking was seen.  Ca1 25 was drawn on April 19, 2019 and was elevated at 957.  The patient reported that she is otherwise healthy and has never been hospitalized or had surgery.  She had never been pregnant.  She had regular menstrual cycles and prior to her presentation her last menstrual period was April 15, 2019.  She has no family history for malignancies gynecologic or otherwise.  The patient just  graduated from [employer] and works for [employer] and was on medical leave at the time of presentation.  Current Meds:  Outpatient Encounter Medications as of 04/22/2019  Medication Sig  . acidophilus (RISAQUAD) CAPS Take 1 capsule by mouth daily.   No  facility-administered encounter medications on file as of 04/22/2019.     Allergy: No Known Allergies  Social Hx:   Social History   Socioeconomic History  . Marital status: Single    Spouse name: Not on file  . Number of children: Not on file  . Years of education: Not on file  . Highest education level: Not on file  Occupational History  . Occupation: TEACHER  Social Needs  . Financial resource strain: Not on file  . Food insecurity    Worry: Not on file    Inability: Not on file  . Transportation needs    Medical: Not on file    Non-medical: Not on file  Tobacco Use  . Smoking status: Never Smoker  . Smokeless tobacco: Never Used  Substance and Sexual Activity  . Alcohol use: No    Alcohol/week: 0.0 standard drinks    Comment: social  . Drug use: No  . Sexual activity: Yes    Birth control/protection: None  Lifestyle  . Physical activity    Days per week: Not on file    Minutes per session: Not on file  . Stress: Not on file  Relationships  . Social Herbalist on phone: Not on file    Gets together: Not on file    Attends religious service: Not on file    Active member of club or organization: Not on file    Attends meetings of clubs or organizations: Not on file    Relationship status: Not on file  . Intimate partner violence    Fear of current or ex partner: Not on file    Emotionally abused: Not on file    Physically abused: Not on file    Forced sexual activity: Not on file  Other Topics Concern  . Not on file  Social History Narrative  . Not on file    Past Surgical Hx: History reviewed. No pertinent surgical history.  Past Medical Hx: History reviewed. No pertinent past medical history.  Past Gynecological History:  See HPI, no hx of abnormal paps, nulliparous.  Patient's last menstrual period was 04/15/2019.  Family Hx:  Family History  Problem Relation Age of Onset  . Diabetes Paternal Grandmother   . Hypertension Other   .  Diabetes Other     Review of Systems:  Constitutional  Feels somewhat fatigued  ENT Normal appearing ears and nares bilaterally Skin/Breast  No rash, sores, jaundice, itching, dryness Cardiovascular  No chest pain, shortness of breath, or edema  Pulmonary  No cough or wheeze.  Gastro Intestinal  No nausea, vomitting, or diarrhoea. No bright red blood per rectum, no abdominal pain, change in bowel movement, + constipation. + distension.  Genito Urinary  No frequency, urgency, dysuria,  Musculo Skeletal  No myalgia, arthralgia, joint swelling or pain  Neurologic  No weakness, numbness, change in gait,  Psychology  No depression, anxiety, insomnia.   Vitals:  Blood pressure 116/68, pulse 84, temperature 98.5 F (36.9 C), temperature source Oral, resp. rate 16, height 5\' 4"  (1.626 m), weight 133 lb (60.3 kg), last menstrual period 04/15/2019, SpO2 99 %.  Physical Exam: WD in NAD Neck  Supple NROM, without any  enlargements.  Lymph Node Survey No cervical supraclavicular or inguinal adenopathy Cardiovascular  Pulse normal rate, regularity and rhythm. S1 and S2 normal.  Lungs  Clear to auscultation bilateraly, without wheezes/crackles/rhonchi. Good air movement.  Skin  No rash/lesions/breakdown  Psychiatry  Alert and oriented to person, place, and time  Abdomen  Normoactive bowel sounds, abdomen soft, non-tender and thin and very distended and dull to percuss without evidence of hernia.  Back No CVA tenderness Genito Urinary  Vulva/vagina: Normal external female genitalia.   No lesions. No discharge or bleeding.  Bladder/urethra:  No lesions or masses, well supported bladder  Vagina: normal  Cervix: Normal appearing, no lesions.  Uterus:  Small, mobile, no parametrial involvement or nodularity.  Adnexa: unable to appreciate masses. Rectal  Good tone, no masses no cul de sac nodularity.  Extremities  No bilateral cyanosis, clubbing or edema.   Thereasa Solo, MD   04/22/2019, 11:35 AM

## 2019-04-23 ENCOUNTER — Ambulatory Visit (HOSPITAL_COMMUNITY)
Admission: RE | Admit: 2019-04-23 | Discharge: 2019-04-23 | Disposition: A | Discharge status: Home / Self Care | Source: Ambulatory Visit | Attending: Gynecologic Oncology | Admitting: Gynecologic Oncology

## 2019-04-23 DIAGNOSIS — R971 Elevated cancer antigen 125 [CA 125]: Secondary | ICD-10-CM | POA: Insufficient documentation

## 2019-04-23 DIAGNOSIS — N838 Other noninflammatory disorders of ovary, fallopian tube and broad ligament: Secondary | ICD-10-CM | POA: Diagnosis present

## 2019-04-23 DIAGNOSIS — R188 Other ascites: Secondary | ICD-10-CM | POA: Diagnosis present

## 2019-04-23 MED ORDER — LIDOCAINE HCL 1 % IJ SOLN
INTRAMUSCULAR | Status: AC
  Filled 2019-04-23: qty 20

## 2019-04-23 NOTE — Procedures (Signed)
Ultrasound-guided diagnostic and therapeutic paracentesis performed yielding 5.2 liters of yellow  fluid. No immediate complications.  A portion of the fluid was submitted to the lab for cytology. EBL < 1 cc.

## 2019-04-24 LAB — CYTOLOGY - NON PAP

## 2019-04-25 ENCOUNTER — Telehealth: Payer: Self-pay

## 2019-04-25 NOTE — Telephone Encounter (Signed)
Told Ms Schlau that the fluid drawn off did not show any cancer cells This does not mean that the masses aren't cancerous.  This will not be known until surgery. Pt verbalized understanding. Pt feels a lot better since paracentesis.

## 2019-04-28 ENCOUNTER — Telehealth: Payer: Self-pay | Admitting: *Deleted

## 2019-04-28 NOTE — Telephone Encounter (Signed)
Per voicemail request and patient permission, fax office note to Mrs. Palmer at [facility] at [phone]

## 2019-04-28 NOTE — Patient Instructions (Signed)
DUE TO COVID-19 ONLY ONE VISITOR IS ALLOWED TO COME WITH YOU AND STAY IN THE WAITING ROOM ONLY DURING PRE OP AND PROCEDURE DAY OF SURGERY. THE 1 VISITOR MAY VISIT WITH YOU AFTER SURGERY IN YOUR PRIVATE ROOM DURING VISITING HOURS ONLY!  YOU NEED TO HAVE A COVID 19 TEST ON__11/6/2020_____ @____0915___ , THIS TEST MUST BE DONE BEFORE SURGERY, COME  [place] , [address].  ([place]) ONCE YOUR COVID TEST IS COMPLETED, PLEASE BEGIN THE QUARANTINE INSTRUCTIONS AS OUTLINED IN YOUR HANDOUT.                Judith Craig    Your procedure is scheduled on: 05/06/2019   Report to [facility] Main  Entrance   Report to admitting at [place] AM     Call this number if you have problems the morning of surgery [phone]    Remember: Do not eat food or drink liquids :After Midnight. BRUSH YOUR TEETH MORNING OF SURGERY AND RINSE YOUR MOUTH OUT, NO CHEWING GUM CANDY OR MINTS.     Take these medicines the morning of surgery with A SIP OF WATER: NONE                                 You may not have any metal on your body including hair pins and              piercings  Do not wear jewelry, make-up, lotions, powders or perfumes, deodorant             Do not wear nail polish on your fingernails.  Do not shave  48 hours prior to surgery.              Men may shave face and neck.   Do not bring valuables to the hospital. [place].  Contacts, dentures or bridgework may not be worn into surgery.  Leave suitcase in the car. After surgery it may be brought to your room.     NO SOLID FOOD AFTER MIDNIGHT THE NIGHT PRIOR TO SURGERY. NOTHING BY MOUTH EXCEPT CLEAR LIQUIDS UNTIL 0430 . PLEASE FINISH ENSURE DRINK PER SURGEON ORDER  WHICH NEEDS TO BE COMPLETED AT 0430 .     CLEAR LIQUID DIET   Foods Allowed                                                                     Foods Excluded  Coffee and tea, regular  and decaf                             liquids that you cannot  Plain Jell-O any favor except red or purple                                           see through such as: Fruit ices (not with fruit pulp)  milk, soups, orange juice  Iced Popsicles                                    All solid food Carbonated beverages, regular and diet                                    Cranberry, grape and apple juices Sports drinks like Gatorade Lightly seasoned clear broth or consume(fat free) Sugar, honey syrup  Sample Menu Breakfast                                Lunch                                     Supper Cranberry juice                    Beef broth                            Chicken broth Jell-O                                     Grape juice                           Apple juice Coffee or tea                        Jell-O                                      Popsicle                                                Coffee or tea                        Coffee or tea  _____________________________________________________________________  [facility] - Preparing for Surgery Before surgery, you can play an important role.  Because skin is not sterile, your skin needs to be as free of germs as possible.  You can reduce the number of germs on your skin by washing with CHG (chlorahexidine gluconate) soap before surgery.  CHG is an antiseptic cleaner which kills germs and bonds with the skin to continue killing germs even after washing. Please DO NOT use if you have an allergy to CHG or antibacterial soaps.  If your skin becomes reddened/irritated stop using the CHG and inform your nurse when you arrive at Short Stay. Do not shave (including legs and underarms) for at least 48 hours prior to the first CHG shower.  You may shave your face/neck. Please follow these instructions carefully:  1.  Shower with CHG Soap the night before surgery and the  morning of Surgery.  2.  If  you choose to  wash your hair, wash your hair first as usual with your  normal  shampoo.  3.  After you shampoo, rinse your hair and body thoroughly to remove the  shampoo.                           4.  Use CHG as you would any other liquid soap.  You can apply chg directly  to the skin and wash                       Gently with a scrungie or clean washcloth.  5.  Apply the CHG Soap to your body ONLY FROM THE NECK DOWN.   Do not use on face/ open                           Wound or open sores. Avoid contact with eyes, ears mouth and genitals (private parts).                       Wash face,  Genitals (private parts) with your normal soap.             6.  Wash thoroughly, paying special attention to the area where your surgery  will be performed.  7.  Thoroughly rinse your body with warm water from the neck down.  8.  DO NOT shower/wash with your normal soap after using and rinsing off  the CHG Soap.                9.  Pat yourself dry with a clean towel.            10.  Wear clean pajamas.            11.  Place clean sheets on your bed the night of your first shower and do not  sleep with pets. Day of Surgery : Do not apply any lotions/deodorants the morning of surgery.  Please wear clean clothes to the hospital/surgery center.  FAILURE TO FOLLOW THESE INSTRUCTIONS MAY RESULT IN THE CANCELLATION OF YOUR SURGERY PATIENT SIGNATURE_________________________________  NURSE SIGNATURE__________________________________  ________________________________________________________________________    Judith Craig  An incentive spirometer is a tool that can help keep your lungs clear and active. This tool measures how well you are filling your lungs with each breath. Taking long deep breaths may help reverse or decrease the chance of developing breathing (pulmonary) problems (especially infection) following:  A long period of time when you are unable to move or be active. BEFORE THE PROCEDURE    If the spirometer includes an indicator to show your best effort, your nurse or respiratory therapist will set it to a desired goal.  If possible, sit up straight or lean slightly forward. Try not to slouch.  Hold the incentive spirometer in an upright position. INSTRUCTIONS FOR USE  1. Sit on the edge of your bed if possible, or sit up as far as you can in bed or on a chair. 2. Hold the incentive spirometer in an upright position. 3. Breathe out normally. 4. Place the mouthpiece in your mouth and seal your lips tightly around it. 5. Breathe in slowly and as deeply as possible, raising the piston or the ball toward the top of the column. 6. Hold your breath for 3-5 seconds or for as long as possible. Allow the piston or ball to fall to the  bottom of the column. 7. Remove the mouthpiece from your mouth and breathe out normally. 8. Rest for a few seconds and repeat Steps 1 through 7 at least 10 times every 1-2 hours when you are awake. Take your time and take a few normal breaths between deep breaths. 9. The spirometer may include an indicator to show your best effort. Use the indicator as a goal to work toward during each repetition. 10. After each set of 10 deep breaths, practice coughing to be sure your lungs are clear. If you have an incision (the cut made at the time of surgery), support your incision when coughing by placing a pillow or rolled up towels firmly against it. Once you are able to get out of bed, walk around indoors and cough well. You may stop using the incentive spirometer when instructed by your caregiver.  RISKS AND COMPLICATIONS  Take your time so you do not get dizzy or light-headed.  If you are in pain, you may need to take or ask for pain medication before doing incentive spirometry. It is harder to take a deep breath if you are having pain. AFTER USE  Rest and breathe slowly and easily.  It can be helpful to keep track of a log of your progress. Your caregiver  can provide you with a simple table to help with this. If you are using the spirometer at home, follow these instructions: [place] IF:   You are having difficultly using the spirometer.  You have trouble using the spirometer as often as instructed.  Your pain medication is not giving enough relief while using the spirometer.  You develop fever of 100.5 F (38.1 C) or higher. SEEK IMMEDIATE MEDICAL CARE IF:   You cough up bloody sputum that had not been present before.  You develop fever of 102 F (38.9 C) or greater.  You develop worsening pain at or near the incision site. MAKE SURE YOU:   Understand these instructions.  Will watch your condition.  Will get help right away if you are not doing well or get worse. Document Released: 10/23/2006 Document Revised: 09/04/2011 Document Reviewed: 12/24/2006 ExitCare Patient Information 2014 ExitCare, [place].   ________________________________________________________________________   WHAT IS A BLOOD TRANSFUSION? Blood Transfusion Information  A transfusion is the replacement of blood or some of its parts. Blood is made up of multiple cells which provide different functions.  Red blood cells carry oxygen and are used for blood loss replacement.  White blood cells fight against infection.  Platelets control bleeding.  Plasma helps clot blood.  Other blood products are available for specialized needs, such as hemophilia or other clotting disorders. BEFORE THE TRANSFUSION  Who gives blood for transfusions?   Healthy volunteers who are fully evaluated to make sure their blood is safe. This is blood bank blood. Transfusion therapy is the safest it has ever been in the practice of medicine. Before blood is taken from a donor, a complete history is taken to make sure that person has no history of diseases nor engages in risky social behavior (examples are intravenous drug use or sexual activity with multiple partners).  The donor's travel history is screened to minimize risk of transmitting infections, such as malaria. The donated blood is tested for signs of infectious diseases, such as HIV and hepatitis. The blood is then tested to be sure it is compatible with you in order to minimize the chance of a transfusion reaction. If you or a relative donates blood,  this is often done in anticipation of surgery and is not appropriate for emergency situations. It takes many days to process the donated blood. RISKS AND COMPLICATIONS Although transfusion therapy is very safe and saves many lives, the main dangers of transfusion include:   Getting an infectious disease.  Developing a transfusion reaction. This is an allergic reaction to something in the blood you were given. Every precaution is taken to prevent this. The decision to have a blood transfusion has been considered carefully by your caregiver before blood is given. Blood is not given unless the benefits outweigh the risks. AFTER THE TRANSFUSION  Right after receiving a blood transfusion, you will usually feel much better and more energetic. This is especially true if your red blood cells have gotten low (anemic). The transfusion raises the level of the red blood cells which carry oxygen, and this usually causes an energy increase.  The nurse administering the transfusion will monitor you carefully for complications. HOME CARE INSTRUCTIONS  No special instructions are needed after a transfusion. You may find your energy is better. Speak with your caregiver about any limitations on activity for underlying diseases you may have. SEEK MEDICAL CARE IF:   Your condition is not improving after your transfusion.  You develop redness or irritation at the intravenous (IV) site. SEEK IMMEDIATE MEDICAL CARE IF:  Any of the following symptoms occur over the next 12 hours:  Shaking chills.  You have a temperature by mouth above 102 F (38.9 C), not controlled by  medicine.  Chest, back, or muscle pain.  People around you feel you are not acting correctly or are confused.  Shortness of breath or difficulty breathing.  Dizziness and fainting.  You get a rash or develop hives.  You have a decrease in urine output.  Your urine turns a dark color or changes to pink, red, or brown. Any of the following symptoms occur over the next 10 days:  You have a temperature by mouth above 102 F (38.9 C), not controlled by medicine.  Shortness of breath.  Weakness after normal activity.  The white part of the eye turns yellow (jaundice).  You have a decrease in the amount of urine or are urinating less often.  Your urine turns a dark color or changes to pink, red, or brown. Document Released: 06/09/2000 Document Revised: 09/04/2011 Document Reviewed: 01/27/2008 [facility] Patient Information 2014 [place], [place].  _______________________________________________________________________

## 2019-04-29 ENCOUNTER — Telehealth: Payer: Self-pay | Admitting: *Deleted

## 2019-04-29 NOTE — Telephone Encounter (Signed)
Patient called for the location of her per op appt tomorrow. Gave the patient time and information to go to the admitting office. Also gave date/time for COVID screening on Friday; reminded patient that after the test she will have to be quarantined until the day of her surgery

## 2019-04-30 ENCOUNTER — Other Ambulatory Visit: Payer: Self-pay

## 2019-04-30 ENCOUNTER — Encounter (HOSPITAL_COMMUNITY)
Admission: RE | Admit: 2019-04-30 | Discharge: 2019-04-30 | Disposition: A | Discharge status: Home / Self Care | Source: Ambulatory Visit | Attending: Gynecologic Oncology | Admitting: Gynecologic Oncology

## 2019-04-30 ENCOUNTER — Encounter (HOSPITAL_COMMUNITY): Payer: Self-pay

## 2019-04-30 DIAGNOSIS — Z01812 Encounter for preprocedural laboratory examination: Secondary | ICD-10-CM | POA: Diagnosis not present

## 2019-04-30 DIAGNOSIS — N839 Noninflammatory disorder of ovary, fallopian tube and broad ligament, unspecified: Secondary | ICD-10-CM | POA: Insufficient documentation

## 2019-04-30 HISTORY — DX: Other specified health status: Z78.9

## 2019-04-30 LAB — URINALYSIS, ROUTINE W REFLEX MICROSCOPIC
Glucose, UA: NEGATIVE mg/dL
Hgb urine dipstick: NEGATIVE
Ketones, ur: NEGATIVE mg/dL
Nitrite: NEGATIVE
Protein, ur: 30 mg/dL — AB
Specific Gravity, Urine: 1.032 — ABNORMAL HIGH (ref 1.005–1.030)
pH: 5 (ref 5.0–8.0)

## 2019-04-30 LAB — COMPREHENSIVE METABOLIC PANEL
ALT: 13 U/L (ref 0–44)
AST: 15 U/L (ref 15–41)
Albumin: 3.1 g/dL — ABNORMAL LOW (ref 3.5–5.0)
Alkaline Phosphatase: 51 U/L (ref 38–126)
Anion gap: 6 (ref 5–15)
BUN: 11 mg/dL (ref 6–20)
CO2: 26 mmol/L (ref 22–32)
Calcium: 8.8 mg/dL — ABNORMAL LOW (ref 8.9–10.3)
Chloride: 104 mmol/L (ref 98–111)
Creatinine, Ser: 0.94 mg/dL (ref 0.44–1.00)
GFR calc Af Amer: >60 mL/min (ref >60)
GFR calc non Af Amer: >60 mL/min (ref >60)
Glucose, Bld: 107 mg/dL — ABNORMAL HIGH (ref 70–99)
Potassium: 4.1 mmol/L (ref 3.5–5.1)
Sodium: 136 mmol/L (ref 135–145)
Total Bilirubin: 0.8 mg/dL (ref 0.3–1.2)
Total Protein: 6.5 g/dL (ref 6.5–8.1)

## 2019-04-30 LAB — CBC
HCT: 43.9 % (ref 36.0–46.0)
Hemoglobin: 14 g/dL (ref 12.0–15.0)
MCH: 30.2 pg (ref 26.0–34.0)
MCHC: 31.9 g/dL (ref 30.0–36.0)
MCV: 94.6 fL (ref 80.0–100.0)
Platelets: 426 10*3/uL — ABNORMAL HIGH (ref 150–400)
RBC: 4.64 MIL/uL (ref 3.87–5.11)
RDW: 13.2 % (ref 11.5–15.5)
WBC: 8 10*3/uL (ref 4.0–10.5)
nRBC: 0 % (ref 0.0–0.2)

## 2019-04-30 LAB — ABO/RH: ABO/RH(D): A NEG

## 2019-04-30 NOTE — Progress Notes (Signed)
SPOKE W/  _ [place]     SCREENING SYMPTOMS OF COVID 19:   COUGH--NO  RUNNY NOSE--- NO  SORE THROAT---NO  NASAL CONGESTION----NO  SNEEZING----NO  SHORTNESS OF BREATH---NO  DIFFICULTY BREATHING---NO  TEMP >100.0 -----NO  UNEXPLAINED BODY ACHES------NO CHILLS -------- NO  HEADACHES ---------NO  LOSS OF SMELL/ TASTE --------NO    HAVE YOU OR ANY FAMILY MEMBER TRAVELLED PAST 14 DAYS OUT OF THE   COUNTY---YES STATE----YES COUNTRY----NO  HAVE YOU OR ANY FAMILY MEMBER BEEN EXPOSED TO ANYONE WITH COVID 19? NO

## 2019-04-30 NOTE — Progress Notes (Signed)
PCP -  No PCP per pt Cardiologist - n/a  Chest x-ray - n/a EKG - n/a Stress Test - n/a ECHO - n/a Cardiac Cath - n/a  Sleep Study - n/a CPAP - n/a  Fasting Blood Sugar - n/a Checks Blood Sugar _____ times a day  Blood Thinner Instructions:n/a Aspirin Instructions:n/a Last Dose:  Anesthesia review: No review needed.  Patient denies shortness of breath, fever, cough and chest pain at PAT appointment   Patient verbalized understanding of instructions that were given to them at the PAT appointment. Patient was also instructed that they will need to review over the PAT instructions again at home before surgery.

## 2019-05-01 LAB — URINE CULTURE: Culture: NO GROWTH

## 2019-05-02 ENCOUNTER — Other Ambulatory Visit (HOSPITAL_COMMUNITY)
Admission: RE | Admit: 2019-05-02 | Discharge: 2019-05-02 | Disposition: A | Discharge status: Home / Self Care | Source: Ambulatory Visit | Attending: Gynecologic Oncology | Admitting: Gynecologic Oncology

## 2019-05-02 DIAGNOSIS — Z01812 Encounter for preprocedural laboratory examination: Secondary | ICD-10-CM | POA: Insufficient documentation

## 2019-05-02 DIAGNOSIS — Z20828 Contact with and (suspected) exposure to other viral communicable diseases: Secondary | ICD-10-CM | POA: Insufficient documentation

## 2019-05-03 LAB — NOVEL CORONAVIRUS, NAA (HOSP ORDER, SEND-OUT TO REF LAB; TAT 18-24 HRS): SARS-CoV-2, NAA: NOT DETECTED

## 2019-05-05 ENCOUNTER — Telehealth: Payer: Self-pay

## 2019-05-05 NOTE — Telephone Encounter (Signed)
Spoke with Judith Craig and had no questions regarding her surgery tomorrow.

## 2019-05-05 NOTE — Anesthesia Preprocedure Evaluation (Addendum)
Anesthesia Evaluation  Patient identified by MRN, date of birth, ID band Patient awake    Reviewed: Allergy & Precautions, NPO status , Patient's Chart, lab work & pertinent test results  Airway Mallampati: I  TM Distance: >3 FB Neck ROM: Full    Dental  (+) Teeth Intact, Dental Advisory Given   Pulmonary neg pulmonary ROS,    Pulmonary exam normal breath sounds clear to auscultation       Cardiovascular negative cardio ROS Normal cardiovascular exam Rhythm:Regular Rate:Normal     Neuro/Psych negative neurological ROS  negative psych ROS   GI/Hepatic negative GI ROS, Neg liver ROS,   Endo/Other  negative endocrine ROS  Renal/GU negative Renal ROS     Musculoskeletal negative musculoskeletal ROS (+)   Abdominal   Peds  Hematology negative hematology ROS (+)   Anesthesia Other Findings Day of surgery medications reviewed with the patient.  Reproductive/Obstetrics BILATERAL OVARIAN MASSES,ASCITES ELEVATED CA 125                           Anesthesia Physical Anesthesia Plan  ASA: II  Anesthesia Plan:    Post-op Pain Management:    Induction:   PONV Risk Score and Plan: 4 or greater and Scopolamine patch - Pre-op, Midazolam, Dexamethasone, Ondansetron and Diphenhydramine  Airway Management Planned: Oral ETT  Additional Equipment:   Intra-op Plan:   Post-operative Plan: Extubation in OR  Informed Consent: I have reviewed the patients History and Physical, chart, labs and discussed the procedure including the risks, benefits and alternatives for the proposed anesthesia with the patient or authorized representative who has indicated his/her understanding and acceptance.     Dental advisory given  Plan Discussed with: CRNA  Anesthesia Plan Comments: (2nd PIV after induction)       Anesthesia Quick Evaluation

## 2019-05-06 ENCOUNTER — Inpatient Hospital Stay (HOSPITAL_COMMUNITY): Admitting: Anesthesiology

## 2019-05-06 ENCOUNTER — Inpatient Hospital Stay (HOSPITAL_COMMUNITY)
Admission: RE | Admit: 2019-05-06 | Discharge: 2019-05-08 | DRG: 742 | Disposition: A | Discharge status: Home / Self Care | Attending: Gynecologic Oncology | Admitting: Gynecologic Oncology

## 2019-05-06 ENCOUNTER — Encounter (HOSPITAL_COMMUNITY): Payer: Self-pay | Admitting: Emergency Medicine

## 2019-05-06 ENCOUNTER — Other Ambulatory Visit: Payer: Self-pay

## 2019-05-06 ENCOUNTER — Encounter (HOSPITAL_COMMUNITY)
Admission: RE | Disposition: A | Discharge status: Home / Self Care | Payer: Self-pay | Source: Home / Self Care | Attending: Gynecologic Oncology

## 2019-05-06 DIAGNOSIS — D259 Leiomyoma of uterus, unspecified: Principal | ICD-10-CM | POA: Diagnosis present

## 2019-05-06 DIAGNOSIS — N839 Noninflammatory disorder of ovary, fallopian tube and broad ligament, unspecified: Secondary | ICD-10-CM | POA: Diagnosis present

## 2019-05-06 DIAGNOSIS — Z79899 Other long term (current) drug therapy: Secondary | ICD-10-CM | POA: Diagnosis not present

## 2019-05-06 DIAGNOSIS — Z833 Family history of diabetes mellitus: Secondary | ICD-10-CM | POA: Diagnosis not present

## 2019-05-06 DIAGNOSIS — N838 Other noninflammatory disorders of ovary, fallopian tube and broad ligament: Secondary | ICD-10-CM | POA: Diagnosis present

## 2019-05-06 DIAGNOSIS — D62 Acute posthemorrhagic anemia: Secondary | ICD-10-CM | POA: Diagnosis not present

## 2019-05-06 DIAGNOSIS — Z8249 Family history of ischemic heart disease and other diseases of the circulatory system: Secondary | ICD-10-CM | POA: Diagnosis not present

## 2019-05-06 DIAGNOSIS — R1900 Intra-abdominal and pelvic swelling, mass and lump, unspecified site: Secondary | ICD-10-CM

## 2019-05-06 DIAGNOSIS — R188 Other ascites: Secondary | ICD-10-CM | POA: Diagnosis present

## 2019-05-06 DIAGNOSIS — R971 Elevated cancer antigen 125 [CA 125]: Secondary | ICD-10-CM | POA: Diagnosis present

## 2019-05-06 HISTORY — PX: ABDOMINAL HYSTERECTOMY: SHX81

## 2019-05-06 HISTORY — PX: OMENTECTOMY: SHX5985

## 2019-05-06 LAB — DIC (DISSEMINATED INTRAVASCULAR COAGULATION)PANEL
D-Dimer, Quant: 6.12 ug/mL-FEU — ABNORMAL HIGH (ref 0.00–0.50)
Fibrinogen: 336 mg/dL (ref 210–475)
INR: 1.2 (ref 0.8–1.2)
Platelets: 243 10*3/uL (ref 150–400)
Prothrombin Time: 15.4 seconds — ABNORMAL HIGH (ref 11.4–15.2)
Smear Review: NONE SEEN
aPTT: 28 seconds (ref 24–36)

## 2019-05-06 LAB — POCT I-STAT EG7
Acid-base deficit: 3 mmol/L — ABNORMAL HIGH (ref 0.0–2.0)
Bicarbonate: 24 mmol/L (ref 20.0–28.0)
Calcium, Ion: 1.18 mmol/L (ref 1.15–1.40)
HCT: 30 % — ABNORMAL LOW (ref 36.0–46.0)
Hemoglobin: 10.2 g/dL — ABNORMAL LOW (ref 12.0–15.0)
O2 Saturation: 70 %
Potassium: 4.2 mmol/L (ref 3.5–5.1)
Sodium: 140 mmol/L (ref 135–145)
TCO2: 26 mmol/L (ref 22–32)
pCO2, Ven: 53.9 mmHg (ref 44.0–60.0)
pH, Ven: 7.256 (ref 7.250–7.430)
pO2, Ven: 43 mmHg (ref 32.0–45.0)

## 2019-05-06 LAB — BASIC METABOLIC PANEL
Anion gap: 4 — ABNORMAL LOW (ref 5–15)
Anion gap: 5 (ref 5–15)
BUN: 6 mg/dL (ref 6–20)
BUN: 6 mg/dL (ref 6–20)
CO2: 25 mmol/L (ref 22–32)
CO2: 25 mmol/L (ref 22–32)
Calcium: 7.3 mg/dL — ABNORMAL LOW (ref 8.9–10.3)
Calcium: 7.5 mg/dL — ABNORMAL LOW (ref 8.9–10.3)
Chloride: 108 mmol/L (ref 98–111)
Chloride: 106 mmol/L (ref 98–111)
Creatinine, Ser: 0.68 mg/dL (ref 0.44–1.00)
Creatinine, Ser: 0.68 mg/dL (ref 0.44–1.00)
GFR calc Af Amer: >60 mL/min (ref >60)
GFR calc Af Amer: >60 mL/min (ref >60)
GFR calc non Af Amer: >60 mL/min (ref >60)
GFR calc non Af Amer: >60 mL/min (ref >60)
Glucose, Bld: 134 mg/dL — ABNORMAL HIGH (ref 70–99)
Glucose, Bld: 145 mg/dL — ABNORMAL HIGH (ref 70–99)
Potassium: 4.1 mmol/L (ref 3.5–5.1)
Potassium: 4.3 mmol/L (ref 3.5–5.1)
Sodium: 137 mmol/L (ref 135–145)
Sodium: 136 mmol/L (ref 135–145)

## 2019-05-06 LAB — CBC
HCT: 32 % — ABNORMAL LOW (ref 36.0–46.0)
HCT: 30.4 % — ABNORMAL LOW (ref 36.0–46.0)
Hemoglobin: 10.4 g/dL — ABNORMAL LOW (ref 12.0–15.0)
Hemoglobin: 10.1 g/dL — ABNORMAL LOW (ref 12.0–15.0)
MCH: 29.6 pg (ref 26.0–34.0)
MCH: 30.1 pg (ref 26.0–34.0)
MCHC: 32.5 g/dL (ref 30.0–36.0)
MCHC: 33.2 g/dL (ref 30.0–36.0)
MCV: 91.2 fL (ref 80.0–100.0)
MCV: 90.5 fL (ref 80.0–100.0)
Platelets: 257 10*3/uL (ref 150–400)
Platelets: 258 10*3/uL (ref 150–400)
RBC: 3.51 MIL/uL — ABNORMAL LOW (ref 3.87–5.11)
RBC: 3.36 MIL/uL — ABNORMAL LOW (ref 3.87–5.11)
RDW: 13.9 % (ref 11.5–15.5)
RDW: 14 % (ref 11.5–15.5)
WBC: 12.8 10*3/uL — ABNORMAL HIGH (ref 4.0–10.5)
WBC: 12.9 10*3/uL — ABNORMAL HIGH (ref 4.0–10.5)
nRBC: 0 % (ref 0.0–0.2)
nRBC: 0 % (ref 0.0–0.2)

## 2019-05-06 LAB — POCT I-STAT, CHEM 8
BUN: 3 mg/dL — ABNORMAL LOW (ref 6–20)
Calcium, Ion: 1.18 mmol/L (ref 1.15–1.40)
Chloride: 105 mmol/L (ref 98–111)
Creatinine, Ser: 0.5 mg/dL (ref 0.44–1.00)
Glucose, Bld: 136 mg/dL — ABNORMAL HIGH (ref 70–99)
HCT: 19 % — ABNORMAL LOW (ref 36.0–46.0)
Hemoglobin: 6.5 g/dL — CL (ref 12.0–15.0)
Potassium: 3.9 mmol/L (ref 3.5–5.1)
Sodium: 138 mmol/L (ref 135–145)
TCO2: 21 mmol/L — ABNORMAL LOW (ref 22–32)

## 2019-05-06 LAB — PREPARE RBC (CROSSMATCH)

## 2019-05-06 LAB — PREGNANCY, URINE: Preg Test, Ur: NEGATIVE

## 2019-05-06 LAB — LACTATE DEHYDROGENASE: LDH: 174 U/L (ref 98–192)

## 2019-05-06 SURGERY — HYSTERECTOMY, ABDOMINAL
Anesthesia: General

## 2019-05-06 MED ORDER — PROPOFOL 10 MG/ML IV BOLUS
INTRAVENOUS | Status: AC
  Filled 2019-05-06: qty 20

## 2019-05-06 MED ORDER — SODIUM CHLORIDE 0.9% IV SOLUTION: Freq: Once | INTRAVENOUS | Status: DC

## 2019-05-06 MED ORDER — ENSURE ENLIVE PO LIQD
237.0000 mL | Freq: Two times a day (BID) | ORAL | Status: DC
  Administered 2019-05-06 – 2019-05-08 (×4): 237 mL via ORAL

## 2019-05-06 MED ORDER — PHENYLEPHRINE 40 MCG/ML (10ML) SYRINGE FOR IV PUSH (FOR BLOOD PRESSURE SUPPORT)
PREFILLED_SYRINGE | INTRAVENOUS | Status: AC
  Filled 2019-05-06: qty 10

## 2019-05-06 MED ORDER — PHENYLEPHRINE 40 MCG/ML (10ML) SYRINGE FOR IV PUSH (FOR BLOOD PRESSURE SUPPORT)
PREFILLED_SYRINGE | INTRAVENOUS | Status: DC | PRN
  Administered 2019-05-06 (×2): 120 ug via INTRAVENOUS

## 2019-05-06 MED ORDER — NON FORMULARY: 1.0000 [IU] | Freq: Three times a day (TID) | Status: DC

## 2019-05-06 MED ORDER — KETAMINE HCL 10 MG/ML IJ SOLN
INTRAMUSCULAR | Status: DC | PRN
  Administered 2019-05-06 (×2): 25 mg via INTRAVENOUS

## 2019-05-06 MED ORDER — BUPIVACAINE HCL 0.25 % IJ SOLN
INTRAMUSCULAR | Status: DC | PRN
  Administered 2019-05-06: 20 mL

## 2019-05-06 MED ORDER — SODIUM CHLORIDE (PF) 0.9 % IJ SOLN
INTRAMUSCULAR | Status: AC
  Filled 2019-05-06: qty 50

## 2019-05-06 MED ORDER — LIDOCAINE 2% (20 MG/ML) 5 ML SYRINGE
INTRAMUSCULAR | Status: DC | PRN
  Administered 2019-05-06: 60 mg via INTRAVENOUS

## 2019-05-06 MED ORDER — PREGABALIN 75 MG PO CAPS
75.0000 mg | ORAL_CAPSULE | Freq: Two times a day (BID) | ORAL | Status: DC
  Administered 2019-05-07 – 2019-05-08 (×3): 75 mg via ORAL
  Filled 2019-05-06 (×3): qty 1

## 2019-05-06 MED ORDER — SODIUM CHLORIDE (PF) 0.9 % IJ SOLN
INTRAMUSCULAR | Status: AC
  Filled 2019-05-06: qty 10

## 2019-05-06 MED ORDER — ONDANSETRON HCL 4 MG/2ML IJ SOLN
INTRAMUSCULAR | Status: DC | PRN
  Administered 2019-05-06: 4 mg via INTRAVENOUS

## 2019-05-06 MED ORDER — CALCIUM GLUCONATE-NACL 1-0.675 GM/50ML-% IV SOLN
1.0000 g | Freq: Once | INTRAVENOUS | Status: DC
  Filled 2019-05-06: qty 50

## 2019-05-06 MED ORDER — SODIUM CHLORIDE 0.9% IV SOLUTION: Freq: Once | INTRAVENOUS | Status: AC

## 2019-05-06 MED ORDER — CELECOXIB 200 MG PO CAPS
400.0000 mg | ORAL_CAPSULE | ORAL | Status: AC
  Administered 2019-05-06: 400 mg via ORAL
  Filled 2019-05-06: qty 2

## 2019-05-06 MED ORDER — BUPIVACAINE LIPOSOME 1.3 % IJ SUSP
20.0000 mL | Freq: Once | INTRAMUSCULAR | Status: AC
  Administered 2019-05-06: 20 mL
  Filled 2019-05-06: qty 20

## 2019-05-06 MED ORDER — LIDOCAINE 2% (20 MG/ML) 5 ML SYRINGE
INTRAMUSCULAR | Status: AC
  Filled 2019-05-06: qty 5

## 2019-05-06 MED ORDER — MIDAZOLAM HCL 2 MG/2ML IJ SOLN
INTRAMUSCULAR | Status: DC | PRN
  Administered 2019-05-06 (×2): 1 mg via INTRAVENOUS

## 2019-05-06 MED ORDER — ONDANSETRON HCL 4 MG/2ML IJ SOLN
4.0000 mg | Freq: Four times a day (QID) | INTRAMUSCULAR | Status: DC | PRN
  Filled 2019-05-06: qty 2

## 2019-05-06 MED ORDER — DIPHENHYDRAMINE HCL 50 MG/ML IJ SOLN
INTRAMUSCULAR | Status: DC | PRN
  Administered 2019-05-06: 12.5 mg via INTRAVENOUS

## 2019-05-06 MED ORDER — ACETAMINOPHEN 500 MG PO TABS
1000.0000 mg | ORAL_TABLET | Freq: Four times a day (QID) | ORAL | Status: DC
  Administered 2019-05-06 – 2019-05-08 (×7): 1000 mg via ORAL
  Filled 2019-05-06 (×8): qty 2

## 2019-05-06 MED ORDER — SUFENTANIL CITRATE 50 MCG/ML IV SOLN
INTRAVENOUS | Status: AC
  Filled 2019-05-06: qty 1

## 2019-05-06 MED ORDER — LACTATED RINGERS IV SOLN
INTRAVENOUS | Status: DC
  Administered 2019-05-06 (×2): via INTRAVENOUS

## 2019-05-06 MED ORDER — KCL IN DEXTROSE-NACL 20-5-0.45 MEQ/L-%-% IV SOLN
INTRAVENOUS | Status: DC
  Administered 2019-05-06: 13:00:00 via INTRAVENOUS
  Filled 2019-05-06 (×2): qty 1000

## 2019-05-06 MED ORDER — SUGAMMADEX SODIUM 200 MG/2ML IV SOLN
INTRAVENOUS | Status: DC | PRN
  Administered 2019-05-06: 120 mg via INTRAVENOUS

## 2019-05-06 MED ORDER — 0.9 % SODIUM CHLORIDE (POUR BTL) OPTIME
TOPICAL | Status: DC | PRN
  Administered 2019-05-06: 3000 mL

## 2019-05-06 MED ORDER — OXYCODONE HCL 5 MG PO TABS
5.0000 mg | ORAL_TABLET | ORAL | Status: DC | PRN
  Administered 2019-05-06: 5 mg via ORAL
  Filled 2019-05-06: qty 1

## 2019-05-06 MED ORDER — ENSURE PRE-SURGERY PO LIQD
296.0000 mL | Freq: Once | ORAL | Status: DC
  Filled 2019-05-06: qty 296

## 2019-05-06 MED ORDER — HYDROMORPHONE HCL 1 MG/ML IJ SOLN
0.5000 mg | INTRAMUSCULAR | Status: DC | PRN
  Administered 2019-05-06 (×2): 0.5 mg via INTRAVENOUS
  Filled 2019-05-06 (×2): qty 0.5

## 2019-05-06 MED ORDER — SUCCINYLCHOLINE CHLORIDE 200 MG/10ML IV SOSY
PREFILLED_SYRINGE | INTRAVENOUS | Status: AC
  Filled 2019-05-06: qty 10

## 2019-05-06 MED ORDER — DEXAMETHASONE SODIUM PHOSPHATE 10 MG/ML IJ SOLN
INTRAMUSCULAR | Status: AC
  Filled 2019-05-06: qty 1

## 2019-05-06 MED ORDER — KETOROLAC TROMETHAMINE 15 MG/ML IJ SOLN
15.0000 mg | Freq: Four times a day (QID) | INTRAMUSCULAR | Status: AC
  Administered 2019-05-06 – 2019-05-07 (×3): 15 mg via INTRAVENOUS
  Filled 2019-05-06 (×3): qty 1

## 2019-05-06 MED ORDER — FENTANYL CITRATE (PF) 100 MCG/2ML IJ SOLN
25.0000 ug | INTRAMUSCULAR | Status: DC | PRN
  Administered 2019-05-06 (×2): 50 ug via INTRAVENOUS

## 2019-05-06 MED ORDER — ROCURONIUM BROMIDE 10 MG/ML (PF) SYRINGE
PREFILLED_SYRINGE | INTRAVENOUS | Status: DC | PRN
  Administered 2019-05-06: 50 mg via INTRAVENOUS
  Administered 2019-05-06: 20 mg via INTRAVENOUS

## 2019-05-06 MED ORDER — SODIUM CHLORIDE 0.9 % IV SOLN
2.0000 g | INTRAVENOUS | Status: AC
  Administered 2019-05-06 (×2): 2 g via INTRAVENOUS
  Filled 2019-05-06 (×2): qty 2

## 2019-05-06 MED ORDER — KETOROLAC TROMETHAMINE 30 MG/ML IJ SOLN: 30.0000 mg | Freq: Once | INTRAMUSCULAR | Status: DC | PRN

## 2019-05-06 MED ORDER — FENTANYL CITRATE (PF) 100 MCG/2ML IJ SOLN
INTRAMUSCULAR | Status: AC
  Administered 2019-05-06: 50 ug via INTRAVENOUS
  Filled 2019-05-06: qty 2

## 2019-05-06 MED ORDER — SODIUM CHLORIDE (PF) 0.9 % IJ SOLN
INTRAMUSCULAR | Status: DC | PRN
  Administered 2019-05-06: 10 mL

## 2019-05-06 MED ORDER — SCOPOLAMINE 1 MG/3DAYS TD PT72
1.0000 | MEDICATED_PATCH | TRANSDERMAL | Status: DC
  Administered 2019-05-06: 1.5 mg via TRANSDERMAL
  Filled 2019-05-06: qty 1

## 2019-05-06 MED ORDER — ONDANSETRON HCL 4 MG PO TABS: 4.0000 mg | ORAL_TABLET | Freq: Four times a day (QID) | ORAL | Status: DC | PRN

## 2019-05-06 MED ORDER — PROPOFOL 10 MG/ML IV BOLUS
INTRAVENOUS | Status: DC | PRN
  Administered 2019-05-06: 100 mg via INTRAVENOUS

## 2019-05-06 MED ORDER — GABAPENTIN 300 MG PO CAPS
300.0000 mg | ORAL_CAPSULE | ORAL | Status: AC
  Administered 2019-05-06: 300 mg via ORAL
  Filled 2019-05-06: qty 1

## 2019-05-06 MED ORDER — ONDANSETRON HCL 4 MG/2ML IJ SOLN
INTRAMUSCULAR | Status: AC
  Filled 2019-05-06: qty 2

## 2019-05-06 MED ORDER — KETAMINE HCL 10 MG/ML IJ SOLN
INTRAMUSCULAR | Status: AC
  Filled 2019-05-06: qty 1

## 2019-05-06 MED ORDER — ENOXAPARIN SODIUM 40 MG/0.4ML ~~LOC~~ SOLN
40.0000 mg | SUBCUTANEOUS | Status: AC
  Administered 2019-05-06: 40 mg via SUBCUTANEOUS
  Filled 2019-05-06: qty 0.4

## 2019-05-06 MED ORDER — ALBUMIN HUMAN 5 % IV SOLN
INTRAVENOUS | Status: AC
  Filled 2019-05-06: qty 250

## 2019-05-06 MED ORDER — DEXAMETHASONE SODIUM PHOSPHATE 10 MG/ML IJ SOLN
INTRAMUSCULAR | Status: DC | PRN
  Administered 2019-05-06: 5 mg via INTRAVENOUS

## 2019-05-06 MED ORDER — PROMETHAZINE HCL 25 MG/ML IJ SOLN: 6.2500 mg | INTRAMUSCULAR | Status: DC | PRN

## 2019-05-06 MED ORDER — ALBUMIN HUMAN 5 % IV SOLN
INTRAVENOUS | Status: DC | PRN
  Administered 2019-05-06: 08:00:00 via INTRAVENOUS

## 2019-05-06 MED ORDER — IBUPROFEN 200 MG PO TABS
600.0000 mg | ORAL_TABLET | Freq: Four times a day (QID) | ORAL | Status: DC
  Administered 2019-05-07 – 2019-05-08 (×5): 600 mg via ORAL
  Filled 2019-05-06 (×6): qty 3

## 2019-05-06 MED ORDER — ROCURONIUM BROMIDE 10 MG/ML (PF) SYRINGE
PREFILLED_SYRINGE | INTRAVENOUS | Status: AC
  Filled 2019-05-06: qty 10

## 2019-05-06 MED ORDER — ESTRADIOL 0.1 MG/24HR TD PTWK
0.1000 mg | MEDICATED_PATCH | TRANSDERMAL | Status: DC
  Administered 2019-05-06: 0.1 mg via TRANSDERMAL
  Filled 2019-05-06: qty 1

## 2019-05-06 MED ORDER — ACETAMINOPHEN 500 MG PO TABS
1000.0000 mg | ORAL_TABLET | ORAL | Status: AC
  Administered 2019-05-06: 1000 mg via ORAL
  Filled 2019-05-06: qty 2

## 2019-05-06 MED ORDER — SODIUM CHLORIDE 0.9 % IV SOLN
INTRAVENOUS | Status: DC | PRN
  Administered 2019-05-06 (×2): via INTRAVENOUS

## 2019-05-06 MED ORDER — SENNOSIDES-DOCUSATE SODIUM 8.6-50 MG PO TABS
2.0000 | ORAL_TABLET | Freq: Every day | ORAL | Status: DC
  Administered 2019-05-06 – 2019-05-07 (×2): 2 via ORAL
  Filled 2019-05-06 (×2): qty 2

## 2019-05-06 MED ORDER — BUPIVACAINE HCL (PF) 0.25 % IJ SOLN
INTRAMUSCULAR | Status: AC
  Filled 2019-05-06: qty 30

## 2019-05-06 MED ORDER — LACTATED RINGERS IV SOLN
INTRAVENOUS | Status: DC | PRN
  Administered 2019-05-06 (×2): via INTRAVENOUS

## 2019-05-06 MED ORDER — DEXAMETHASONE SODIUM PHOSPHATE 4 MG/ML IJ SOLN: 4.0000 mg | INTRAMUSCULAR | Status: DC

## 2019-05-06 MED ORDER — LIDOCAINE 2% (20 MG/ML) 5 ML SYRINGE
INTRAMUSCULAR | Status: DC | PRN
  Administered 2019-05-06: 15 mg/kg/h via INTRAVENOUS

## 2019-05-06 MED ORDER — MIDAZOLAM HCL 2 MG/2ML IJ SOLN
INTRAMUSCULAR | Status: AC
  Filled 2019-05-06: qty 2

## 2019-05-06 MED ORDER — LIDOCAINE HCL 2 % IJ SOLN
INTRAMUSCULAR | Status: AC
  Filled 2019-05-06: qty 20

## 2019-05-06 MED ORDER — SUFENTANIL CITRATE 50 MCG/ML IV SOLN
INTRAVENOUS | Status: DC | PRN
  Administered 2019-05-06: 10 ug via INTRAVENOUS
  Administered 2019-05-06: 5 ug via INTRAVENOUS
  Administered 2019-05-06: 10 ug via INTRAVENOUS
  Administered 2019-05-06: 5 ug via INTRAVENOUS
  Administered 2019-05-06 (×2): 10 ug via INTRAVENOUS

## 2019-05-06 MED ORDER — CHEWING GUM (ORBIT) SUGAR FREE
1.0000 | CHEWING_GUM | Freq: Three times a day (TID) | ORAL | Status: DC
  Administered 2019-05-06 – 2019-05-08 (×6): 1 via ORAL
  Filled 2019-05-06: qty 1

## 2019-05-06 SURGICAL SUPPLY — 69 items
ATTRACTOMAT 16X20 MAGNETIC DRP (DRAPES) ×4 IMPLANT
BLADE EXTENDED COATED 6.5IN (ELECTRODE) ×4 IMPLANT
CELLS DAT CNTRL 66122 CELL SVR (MISCELLANEOUS) IMPLANT
CHLORAPREP W/TINT 26 (MISCELLANEOUS) ×4 IMPLANT
CLIP VESOCCLUDE LG 6/CT (CLIP) ×16 IMPLANT
CLIP VESOCCLUDE MED 6/CT (CLIP) ×4 IMPLANT
CLIP VESOCCLUDE MED LG 6/CT (CLIP) ×8 IMPLANT
CONT SPEC 4OZ CLIKSEAL STRL BL (MISCELLANEOUS) IMPLANT
COVER WAND RF STERILE (DRAPES) IMPLANT
DERMABOND ADVANCED (GAUZE/BANDAGES/DRESSINGS)
DERMABOND ADVANCED .7 DNX12 (GAUZE/BANDAGES/DRESSINGS) IMPLANT
DRAPE INCISE 23X17 IOBAN STRL (DRAPES) ×2
DRAPE INCISE IOBAN 23X17 STRL (DRAPES) ×2 IMPLANT
DRAPE INCISE IOBAN 66X45 STRL (DRAPES) IMPLANT
DRAPE WARM FLUID 44X44 (DRAPES) ×4 IMPLANT
DRSG OPSITE POSTOP 4X10 (GAUZE/BANDAGES/DRESSINGS) ×4 IMPLANT
DRSG OPSITE POSTOP 4X6 (GAUZE/BANDAGES/DRESSINGS) IMPLANT
DRSG OPSITE POSTOP 4X8 (GAUZE/BANDAGES/DRESSINGS) IMPLANT
ELECT REM PT RETURN 15FT ADLT (MISCELLANEOUS) ×4 IMPLANT
GAUZE 4X4 16PLY RFD (DISPOSABLE) ×4 IMPLANT
GLOVE BIO SURGEON STRL SZ 6 (GLOVE) ×12 IMPLANT
GLOVE BIO SURGEON STRL SZ 6.5 (GLOVE) ×6 IMPLANT
GLOVE BIO SURGEONS STRL SZ 6.5 (GLOVE) ×2
GLOVE BIOGEL M 8.0 STRL (GLOVE) ×4 IMPLANT
GLOVE BIOGEL PI IND STRL 7.0 (GLOVE) ×4 IMPLANT
GLOVE BIOGEL PI IND STRL 8 (GLOVE) ×2 IMPLANT
GLOVE BIOGEL PI INDICATOR 7.0 (GLOVE) ×4
GLOVE BIOGEL PI INDICATOR 8 (GLOVE) ×2
GOWN STRL REUS W/ TWL LRG LVL3 (GOWN DISPOSABLE) ×4 IMPLANT
GOWN STRL REUS W/TWL LRG LVL3 (GOWN DISPOSABLE) ×8 IMPLANT
GOWN STRL REUS W/TWL XL LVL3 (GOWN DISPOSABLE) ×4 IMPLANT
HANDLE SUCTION POOLE (INSTRUMENTS) ×2 IMPLANT
HEMOSTAT ARISTA ABSORB 3G PWDR (HEMOSTASIS) IMPLANT
KIT BASIN OR (CUSTOM PROCEDURE TRAY) ×4 IMPLANT
KIT TURNOVER KIT A (KITS) IMPLANT
LIGASURE IMPACT 36 18CM CVD LR (INSTRUMENTS) ×4 IMPLANT
LOOP VESSEL MAXI BLUE (MISCELLANEOUS) IMPLANT
NEEDLE HYPO 22GX1.5 SAFETY (NEEDLE) ×8 IMPLANT
PACK GENERAL/GYN (CUSTOM PROCEDURE TRAY) ×4 IMPLANT
RELOAD PROXIMATE 75MM BLUE (ENDOMECHANICALS) IMPLANT
RELOAD PROXIMATE TA60MM BLUE (ENDOMECHANICALS) IMPLANT
RETRACTOR WND ALEXIS 25 LRG (MISCELLANEOUS) ×2 IMPLANT
RTRCTR WOUND ALEXIS 18CM MED (MISCELLANEOUS)
RTRCTR WOUND ALEXIS 25CM LRG (MISCELLANEOUS) ×4
SHEET LAVH (DRAPES) ×4 IMPLANT
SPONGE LAP 18X18 RF (DISPOSABLE) ×12 IMPLANT
STAPLER GUN LINEAR PROX 60 (STAPLE) IMPLANT
STAPLER PROXIMATE 75MM BLUE (STAPLE) IMPLANT
STAPLER VISISTAT 35W (STAPLE) IMPLANT
SUCTION POOLE HANDLE (INSTRUMENTS) ×4
SURGIFLO W/THROMBIN 8M KIT (HEMOSTASIS) IMPLANT
SUT MNCRL AB 4-0 PS2 18 (SUTURE) ×8 IMPLANT
SUT PDS AB 1 TP1 96 (SUTURE) ×8 IMPLANT
SUT SILK 3 0 SH CR/8 (SUTURE) IMPLANT
SUT VIC AB 0 CT1 36 (SUTURE) ×16 IMPLANT
SUT VIC AB 2-0 CT1 36 (SUTURE) ×8 IMPLANT
SUT VIC AB 2-0 CT2 27 (SUTURE) ×24 IMPLANT
SUT VIC AB 2-0 SH 27 (SUTURE)
SUT VIC AB 2-0 SH 27X BRD (SUTURE) IMPLANT
SUT VIC AB 3-0 CTX 36 (SUTURE) IMPLANT
SUT VIC AB 3-0 SH 18 (SUTURE) IMPLANT
SUT VIC AB 3-0 SH 27 (SUTURE) ×10
SUT VIC AB 3-0 SH 27X BRD (SUTURE) ×10 IMPLANT
SUT VIC AB 4-0 PS2 18 (SUTURE) ×4 IMPLANT
SYR 30ML LL (SYRINGE) ×8 IMPLANT
TOWEL OR 17X26 10 PK STRL BLUE (TOWEL DISPOSABLE) ×4 IMPLANT
TOWEL OR NON WOVEN STRL DISP B (DISPOSABLE) ×4 IMPLANT
TRAY FOLEY MTR SLVR 16FR STAT (SET/KITS/TRAYS/PACK) ×4 IMPLANT
UNDERPAD 30X36 HEAVY ABSORB (UNDERPADS AND DIAPERS) ×4 IMPLANT

## 2019-05-06 NOTE — Transfer of Care (Signed)
Immediate Anesthesia Transfer of Care Note  Patient: Judith Craig  Procedure(s) Performed: OPEN TOTAL HYSTERECTOMY ABDOMINAL,BILATERAL SALPINGO OOPHORECTOMY, (Bilateral ) OMENTECTOMY (N/A )  Patient Location: PACU  Anesthesia Type:General  Level of Consciousness: drowsy  Airway & Oxygen Therapy: Patient Spontanous Breathing and Patient connected to face mask oxygen  Post-op Assessment: Report given to RN and Post -op Vital signs reviewed and stable  Post vital signs: Reviewed and stable  Last Vitals:  Vitals Value Taken Time  BP 101/76 05/06/19 1012  Temp    Pulse 114 05/06/19 1013  Resp 22 05/06/19 1013  SpO2 100 % 05/06/19 1013  Vitals shown include unvalidated device data.  Last Pain:  Vitals:   05/06/19 0553  TempSrc:   PainSc: 0-No pain      Patients Stated Pain Goal: 4 ([id number])  Complications: No apparent anesthesia complications

## 2019-05-06 NOTE — Anesthesia Procedure Notes (Signed)
Procedure Name: Intubation Date/Time: 05/06/2019 7:25 AM Performed by: Sharlette Dense, CRNA Patient Re-evaluated:Patient Re-evaluated prior to induction Oxygen Delivery Method: Circle system utilized Preoxygenation: Pre-oxygenation with 100% oxygen Induction Type: IV induction Ventilation: Mask ventilation without difficulty and Oral airway inserted - appropriate to patient size Laryngoscope Size: Sabra Heck and 2 Grade View: Grade I Tube type: Oral Tube size: 7.0 mm Number of attempts: 1 Airway Equipment and Method: Stylet Placement Confirmation: ETT inserted through vocal cords under direct vision,  positive ETCO2 and breath sounds checked- equal and bilateral Secured at: 20 cm Tube secured with: Tape Dental Injury: Teeth and Oropharynx as per pre-operative assessment

## 2019-05-06 NOTE — Progress Notes (Signed)
Patient has voided

## 2019-05-06 NOTE — Interval H&P Note (Signed)
History and Physical Interval Note:  05/06/2019 7:14 AM  Judith Craig  has presented today for surgery, with the diagnosis of BILATERAL OVARIAN MASSES,ASCITES ELEVATED CA 125.  The various methods of treatment have been discussed with the patient and family. After consideration of risks, benefits and other options for treatment, the patient has consented to  Procedure(s): OPEN TOTAL HYSTERECTOMY ABDOMINAL,BILATERAL SALPINGO OOPHORECTOMY, (Bilateral) OMENTECTOMY,DEBULKING (N/A) as a surgical intervention.  The patient's history has been reviewed, patient examined, no change in status, stable for surgery.  I have reviewed the patient's chart and labs. cytology on the ascites was benign.  Questions were answered to the patient's satisfaction.    Thereasa Solo

## 2019-05-06 NOTE — Op Note (Signed)
OPERATIVE NOTE  Date: 05/06/19  Preoperative Diagnosis: bilateral ovarian masses, ascites, elevated CA 125.    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     Procedure(s) Performed: Exploratory laparotomy total abdominal hysterectomy, bilateral salpingo-oophorectomy, omentectomy  Surgeon: Thereasa Solo, MD.  Assistant Surgeon: Lahoma Crocker, M.D. Assistant: (an MD assistant was necessary for tissue manipulation, retraction and positioning due to the complexity of the case and hospital policies).   Specimens: uterus with cervix and bilateral tubes / ovaries, omentum.    Estimated Blood Loss: 2800 mL.    Ascites: 5000cc  IVF: 4 units PRBC, 3800cc crystalloid, 500cc albumin  Urine Output: [id number]  Complications: None.   Operative Findings: 5000cc of amber ascites. Small uterine body and fundus with 20 cm and 10 cm bilobed mass emanating from the left cornua.  These masses were densely adherent with the anterior abdominal wall including the inferior epigastric vessels and densely adherent to the omentum receiving large caliber blood flow and return from the omentum.  There was also parasitic vessels to the sigmoid colon mesentery.  The ovaries were slightly enlarged but with no obvious masses on them.  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  Frozen section revealed benign leiomyoma. There was no gross visible disease remaining.   Procedure:   The patient was seen in the Holding Room. The risks, benefits, complications, treatment options, and expected outcomes were discussed with the patient.  The patient concurred with the proposed plan, giving informed consent.   The patient was  identified as [employer]  and the procedure verified as TAH, BSO, possible omentectomy, tumor debulking. A Time Out was held and the above information confirmed upon entry to the operating room.  After induction of anesthesia, the patient  was draped and prepped in the usual sterile manner.  She was prepped and draped in the normal sterile fashion in the dorsal lithotomy position in padded Allen stirrups with good attention paid to support of the lower back and lower extremities. Position was adjusted for appropriate support. A Foley catheter was placed to gravity.   A midline vertical incision was made and carried through the subcutaneous tissue to the fascia. The fascial incision was made and extended superiorally. The rectus muscles were separated. The peritoneum was identified and entered. Peritoneal incision was extended longitudinally. At this point 5000cc of ascites was aspirated from the peritoneal cavity.  The abdominal cavity was entered sharply and without incident. A Bookwalter retractor was then placed. A survey of the abdomen and pelvis revealed the above findings, which were significant for a bilobed solid mass emanating from the left uterine fundus densely adherent to the omentum lower anterior pelvic abdominal wall on the left, sigmoid colon mesentery and bladder dome.  The LigaSure bipolar sealing device was used to dissected the uterine mass from the anterior abdominal wall in order to seal the large caliber vessels feeding the mass from the anterior peritoneum and inferior epigastric vessels.  Similarly the LigaSure was used to dissected the infracolic omentum with its large caliber vessels that were feeding the mass.  The omentum was left attached to the mass.  The omental vessels which measured approximately 8 to 10 mm in diameter were then reinforced with metal hemoclips.  During this portion of the dissection there was significant blood loss.  Over the course of 45 minutes approximately 3 L of blood was lost and the patient required transfusion of packed red blood cells.  The clinical  picture was highly concerning for sarcoma given the parasitic nature of the mass infiltrating into the omentum, intra-abdominal wall,  sigmoid mesentery with associated large caliber vessels.  Given the extensive blood loss at the time of the surgery it was not felt that a fertility preserving procedure was feasible at this would be associated with additional blood loss, and there was concern regarding clinical picture for a malignant process.  After the mass had been freed from its attachments to the omentum, sigmoid colon, and anterior peritoneum, a Bookwalter retractor was placed to retract the skin edges and the hysterectomy proceeded.  The retroperitoneum's were opened bilaterally to expose the ureters in the retroperitoneum.  The ovarian vessels were then skeletonized above the level of the ureter and bipolar sealed and transected with the LigaSure.  The posterior leaf of the broad ligament was dissected to the level of the cervicovaginal junction posteriorly and uterine isthmus.  Anteriorly the bladder flap was created to below the level of the cervicovaginal junction.  The Bovie was used to create this this portion of the procedure was hemostatic.  The uterine vessels were clamped bilaterally at the uterine isthmus with curved Heaney clamps then the pedicles were transected and suture ligated. Successive passes of straight Heaney clamps were used down the cardinal ligaments bilaterally with each pass medial to the prior. The pedicles were sharply transected and made hemostatic with suture. The bladder was ensured to be below the cervicovaginal junction, then curved clamps were passed across the cervicovaginal junction and the uterus was sharply transected. The vaginal cuff was closed with 0-vicryl on interrupted figure of 8 suture.  Irrigation of the pelvis then took place and aspiration.  For approximately 30 minutes copious inspection of the peritoneal cavity took place with hemoclipping of large caliber vessels that had appeared to be bleeding or had a high potential risk for bleeding.  There was bleeding noted from the sigmoid  mesentery where it had been densely adherent to the mass.  This was made hemostatic with figure-of-eight 3-0 Vicryl sutures.  The sigmoid hip colon was carefully inspected and noted to have a preserved luminal diameter and viability after achieving hemostasis at the level of the mesentery.  The upper abdomen was carefully inspected blood and ascites were aspirated.  No residual tumor was identified and no bleeding from the omentum liver or spleen was appreciated.  The epigastric vessels were clipped, son, and bipolar sealed on the left lower abdomen to achieve hemostasis at this point.  The frozen section returned as concerning grossly for malignant sarcoma, however on frozen section no malignancy was identified in the leiomyoma.  The fascia was reapproximated with 0 looped PDS using a total of two sutures. The subcutaneous layer was then irrigated copiously.  Exparel long acting local anesthetic was infiltrated into the subcutaneous tissues. The skin was closed with subcuticular suture. The patient tolerated the procedure well.   Sponge, lap and needle counts were correct x 2.   Thereasa Solo, MD

## 2019-05-06 NOTE — Anesthesia Postprocedure Evaluation (Signed)
Anesthesia Post Note  Patient: Judith Craig  Procedure(s) Performed: OPEN TOTAL HYSTERECTOMY ABDOMINAL,BILATERAL SALPINGO OOPHORECTOMY, (Bilateral ) OMENTECTOMY (N/A )     Patient location during evaluation: PACU Anesthesia Type: General Level of consciousness: awake and alert, awake and oriented Pain management: pain level controlled Vital Signs Assessment: post-procedure vital signs reviewed and stable Respiratory status: spontaneous breathing, nonlabored ventilation and respiratory function stable Cardiovascular status: blood pressure returned to baseline and stable Postop Assessment: no apparent nausea or vomiting Anesthetic complications: no    Last Vitals:  Vitals:   05/06/19 1030 05/06/19 1045  BP: (!) 127/59 124/73  Pulse: (!) 108 92  Resp: 18 12  Temp:    SpO2: 100% 100%    Last Pain:  Vitals:   05/06/19 1045  TempSrc:   PainSc: [place]

## 2019-05-07 ENCOUNTER — Encounter (HOSPITAL_COMMUNITY): Payer: Self-pay | Admitting: Gynecologic Oncology

## 2019-05-07 LAB — CBC
HCT: 27.6 % — ABNORMAL LOW (ref 36.0–46.0)
Hemoglobin: 9.1 g/dL — ABNORMAL LOW (ref 12.0–15.0)
MCH: 30.2 pg (ref 26.0–34.0)
MCHC: 33 g/dL (ref 30.0–36.0)
MCV: 91.7 fL (ref 80.0–100.0)
Platelets: 228 10*3/uL (ref 150–400)
RBC: 3.01 MIL/uL — ABNORMAL LOW (ref 3.87–5.11)
RDW: 14.1 % (ref 11.5–15.5)
WBC: 9.2 10*3/uL (ref 4.0–10.5)
nRBC: 0 % (ref 0.0–0.2)

## 2019-05-07 LAB — BASIC METABOLIC PANEL
Anion gap: 3 — ABNORMAL LOW (ref 5–15)
BUN: 5 mg/dL — ABNORMAL LOW (ref 6–20)
CO2: 25 mmol/L (ref 22–32)
Calcium: 7.6 mg/dL — ABNORMAL LOW (ref 8.9–10.3)
Chloride: 108 mmol/L (ref 98–111)
Creatinine, Ser: 0.72 mg/dL (ref 0.44–1.00)
GFR calc Af Amer: >60 mL/min (ref >60)
GFR calc non Af Amer: >60 mL/min (ref >60)
Glucose, Bld: 115 mg/dL — ABNORMAL HIGH (ref 70–99)
Potassium: 3.9 mmol/L (ref 3.5–5.1)
Sodium: 136 mmol/L (ref 135–145)

## 2019-05-07 LAB — AFP TUMOR MARKER: AFP, Serum, Tumor Marker: 2.5 ng/mL (ref 0.0–8.3)

## 2019-05-07 LAB — BETA HCG QUANT (REF LAB): hCG Quant: <1 m[IU]/mL

## 2019-05-07 NOTE — Progress Notes (Signed)
RN called patients mother with permission to update her on patients status. No needs expressed from the patient or family at this time.

## 2019-05-07 NOTE — Progress Notes (Signed)
1 Day Post-Op Procedure(s) (LRB): OPEN TOTAL HYSTERECTOMY ABDOMINAL,BILATERAL SALPINGO OOPHORECTOMY, (Bilateral) OMENTECTOMY (N/A)  Subjective: Patient reports doing well this am. Tolerating liquids with no nausea or emesis.  Pain managed with PRN medications.  Ambulating without difficulty.  Denies chest pain, dyspnea, dizziness.  No flatus or BM reported.  Voiding without difficulty. No concerns voiced.  Objective: Vital signs in last 24 hours: Temp:  [97.6 F (36.4 C)-99.6 F (37.6 C)] 98.3 F (36.8 C) (11/11 0604) Pulse Rate:  [77-117] 85 (11/11 0604) Resp:  [11-20] 18 (11/11 0604) BP: (101-127)/(58-84) 112/65 (11/11 0604) SpO2:  [100 %] 100 % (11/11 0604) Last BM Date: 05/05/19  Intake/Output from previous day: 11/10 0701 - 11/11 0700 In: 8579.2 [P.O.:710; I.V.:6474.2; Blood:945; IV Piggyback:450] Out: [id number] [Urine:2375; Blood:2800]  Physical Examination: General: alert, cooperative and no distress Resp: clear to auscultation bilaterally Cardio: regular rate and rhythm, S1, S2 normal, no murmur, click, rub or gallop GI: soft, non-tender; bowel sounds normal; no masses,  no organomegaly and incision: midline incision with op site dressing in place, no drainage noted underneath Extremities: extremities normal, atraumatic, no cyanosis or edema  Labs: WBC/Hgb/Hct/Plts:  9.2/9.1/27.6/228 (11/11 [id number]) BUN/Cr/glu/ALT/AST/amyl/lip:  5/0.72/--/--/--/--/-- (11/11 0237)  Assessment: 28 y.o. s/p Procedure(s): OPEN TOTAL HYSTERECTOMY ABDOMINAL,BILATERAL SALPINGO OOPHORECTOMY, OMENTECTOMY: stable Pain:  Pain is well-controlled on PRN medications.  Heme: Hgb 9.1 and 27.6 this am. Overall stable compared to post-op H&H last pm.  S/P 2800 cc surgical blood loss with transfusion of 4 units PRBCs.  CV: BP and HR stable. Continue to monitor with ordered routine vital signs.  GI:  Tolerating po: Yes. Scopolamine patch in place. Antiemetics ordered PRN.  GU: Voiding adequate amounts.     FEN: No critical values noted this am.  Prophylaxis: SCDs  Plan: Saline lock IV Diet as tolerated Encourage ambulation, IS use, deep breathing, coughing Plan for possible discharge tomorrow based on how pt is doing Continue plan per Dr. Berline Lopes   LOS: 1 day    Johnthan Axtman D Oziah Vitanza 05/07/2019, 9:31 AM

## 2019-05-08 LAB — BASIC METABOLIC PANEL
Anion gap: 7 (ref 5–15)
BUN: <5 mg/dL — ABNORMAL LOW (ref 6–20)
CO2: 23 mmol/L (ref 22–32)
Calcium: 7.6 mg/dL — ABNORMAL LOW (ref 8.9–10.3)
Chloride: 103 mmol/L (ref 98–111)
Creatinine, Ser: 0.63 mg/dL (ref 0.44–1.00)
GFR calc Af Amer: >60 mL/min (ref >60)
GFR calc non Af Amer: >60 mL/min (ref >60)
Glucose, Bld: 91 mg/dL (ref 70–99)
Potassium: 3.7 mmol/L (ref 3.5–5.1)
Sodium: 133 mmol/L — ABNORMAL LOW (ref 135–145)

## 2019-05-08 LAB — CBC
HCT: 27.1 % — ABNORMAL LOW (ref 36.0–46.0)
Hemoglobin: 8.7 g/dL — ABNORMAL LOW (ref 12.0–15.0)
MCH: 29.9 pg (ref 26.0–34.0)
MCHC: 32.1 g/dL (ref 30.0–36.0)
MCV: 93.1 fL (ref 80.0–100.0)
Platelets: 232 10*3/uL (ref 150–400)
RBC: 2.91 MIL/uL — ABNORMAL LOW (ref 3.87–5.11)
RDW: 13.6 % (ref 11.5–15.5)
WBC: 8.9 10*3/uL (ref 4.0–10.5)
nRBC: 0 % (ref 0.0–0.2)

## 2019-05-08 LAB — SURGICAL PATHOLOGY

## 2019-05-08 LAB — INHIBIN B: Inhibin B: 59.3 pg/mL

## 2019-05-08 MED ORDER — FERROUS GLUCONATE 324 (38 FE) MG PO TABS: 324.0000 mg | ORAL_TABLET | Freq: Two times a day (BID) | ORAL | 1 refills | Status: AC

## 2019-05-08 NOTE — Discharge Summary (Signed)
Physician Discharge Summary  Patient ID: [employer] MRN: [id number] DOB/AGE: [birth year] 28 y.o.  Admit date: 05/06/2019 Discharge date: 05/08/2019  Admission Diagnoses: Mass, ovarian  Discharge Diagnoses:  Principal Problem:   Mass, ovarian Active Problems:   Other ascites   Elevated CA-125   Pelvic mass in female   Discharged Condition:  The patient is in good condition and stable for discharge.   Hospital Course: On 05/06/2019, the patient underwent the following: Procedure(s): OPEN TOTAL HYSTERECTOMY ABDOMINAL,BILATERAL SALPINGO OOPHORECTOMY, OMENTECTOMY.  The postoperative course was uneventful.  She was discharged to home on postoperative day 2 tolerating a regular diet, ambulating, voiding, pain controlled.  Consults: None  Significant Diagnostic Studies: None  Treatments: surgery: see above. 4 units PRBCs given intraoperatively to replace surgical losses.  Discharge Exam: Blood pressure 113/68, pulse 70, temperature 98.6 F (37 C), temperature source Oral, resp. rate 18, height 5\' 4"  (1.626 m), weight 128 lb 8 oz (58.3 kg), last menstrual period 04/15/2019, SpO2 100 %. General appearance: alert, cooperative and no distress Resp: clear to auscultation bilaterally Cardio: regular rate and rhythm, S1, S2 normal, no murmur, click, rub or gallop GI: abdomen slightly distended, hypoactive bowel sounds, slightly tympanic on percussion Extremities: extremities normal, atraumatic, no cyanosis or edema Incision/Wound: Midline incision with op site dressing in place with no drainage noted underneath  Disposition: Discharge disposition: 01-Home or Self Care       Discharge Instructions    Call MD for:  difficulty breathing, headache or visual disturbances   Complete by: As directed    Call MD for:  extreme fatigue   Complete by: As directed    Call MD for:  hives   Complete by: As directed    Call MD for:  persistant dizziness or light-headedness   Complete by:  As directed    Call MD for:  persistant nausea and vomiting   Complete by: As directed    Call MD for:  redness, tenderness, or signs of infection (pain, swelling, redness, odor or green/yellow discharge around incision site)   Complete by: As directed    Call MD for:  severe uncontrolled pain   Complete by: As directed    Call MD for:  temperature >100.4   Complete by: As directed    Diet - low sodium heart healthy   Complete by: As directed    Discharge wound care:   Complete by: As directed    You can remove the white honeycomb dressing over your incision 5 days after surgery. You do not need to reapply another dressing.  Keep the incision clean and dry but you can get it wet in the shower.   Driving Restrictions   Complete by: As directed    No driving for 2 weeks.  Do not take narcotics and drive.   Increase activity slowly   Complete by: As directed    Lifting restrictions   Complete by: As directed    No lifting greater than 10 lbs.   Sexual Activity Restrictions   Complete by: As directed    No sexual activity, nothing in the vagina, for 8 weeks.     Allergies as of 05/08/2019   No Known Allergies     Medication List    TAKE these medications   ferrous gluconate 324 MG tablet Commonly known as: FERGON Take 1 tablet (324 mg total) by mouth 2 (two) times daily with a meal.   ibuprofen 800 MG tablet Commonly known as: ADVIL Take 1 tablet (  800 mg total) by mouth every 8 (eight) hours as needed for moderate pain. For AFTER surgery   oxyCODONE 5 MG immediate release tablet Commonly known as: Oxy IR/ROXICODONE Take 1 tablet (5 mg total) by mouth every 4 (four) hours as needed for severe pain. For AFTER surgery, do not take and drive   PROBIOTIC PO Take 1 capsule by mouth daily.   senna-docusate 8.6-50 MG tablet Commonly known as: Senokot-S Take 2 tablets by mouth at bedtime. For AFTER surgery, do not take if having diarrhea            Discharge Care  Instructions  (From admission, onward)         Start     Ordered   05/08/19 0000  Discharge wound care:    Comments: You can remove the white honeycomb dressing over your incision 5 days after surgery. You do not need to reapply another dressing.  Keep the incision clean and dry but you can get it wet in the shower.   05/08/19 1114         Follow-up Information    Everitt Amber, MD Follow up on 05/21/2019.   Specialty: Gynecologic Oncology Why: at 2 pm at the [facility] for post-op check Contact information: [place] [address] [phone]           Greater than thirty minutes were spend for face to face discharge instructions and discharge orders/summary in EPIC.   Signed: Dorothyann Gibbs 05/08/2019, 11:19 AM

## 2019-05-08 NOTE — Discharge Instructions (Signed)
05/06/2019  Return to work: 6 weeks  Activity: 1. Be up and out of the bed during the day.  Take a nap if needed.  You may walk up steps but be careful and use the hand rail.  Stair climbing will tire you more than you think, you may need to stop part way and rest.   2. No lifting or straining for 6 weeks.  3. No driving for 2 weeks.  Do Not drive if you are taking narcotic pain medicine.  4. Shower daily.  Use soap and water on your incision and pat dry; don't rub.   5. No sexual activity and nothing in the vagina for 8 weeks.  6. You can remove the white honeycomb dressing from the abdomen incision 5 days after surgery. You do not need to apply a new dressing.  Medications:  - Take ibuprofen and tylenol first line for pain control. Take these regularly (every 6 hours) to decrease the build up of pain.  - If necessary, for severe pain not relieved by ibuprofen, take oxycodone.  - While taking oxycodone you should take sennakot every night to reduce the likelihood of constipation. If this causes diarrhea, stop its use.  Diet: 1. Low sodium Heart Healthy Diet is recommended.  2. It is safe to use a laxative if you have difficulty moving your bowels.   Wound Care: 1. Keep clean and dry.  Shower daily. 2. You can get the dressing wet in the shower, but avoid tub baths. 3. Remove the dressing between 5 and 7 days after your surgery (1 week after your operation). 4. After the dressing is removed you can get the incision wet in the shower, however continue to avoid tub baths until advised otherwise by your surgeon at follow-up.   Reasons to call the Doctor:   Fever - Oral temperature greater than 100.4 degrees Fahrenheit  Foul-smelling vaginal discharge  Difficulty urinating  Nausea and vomiting  Increased pain at the site of the incision that is unrelieved with pain medicine.  Difficulty breathing with or without chest pain  New calf pain especially if only on one  side  Sudden, continuing increased vaginal bleeding with or without clots.   Follow-up: 1. See Everitt Amber in 3 weeks.  Contacts: For questions or concerns you should contact:  Dr. Everitt Amber at [phone] After hours and on week-ends call [phone] and ask to speak to the physician on call for Gynecologic Oncology

## 2019-05-08 NOTE — Plan of Care (Signed)
Pt was discharged home today. Instructions were reviewed with patient, and questions were answered. Pt was taken to main entrance via wheelchair by NT.  

## 2019-05-09 ENCOUNTER — Telehealth: Payer: Self-pay

## 2019-05-09 ENCOUNTER — Ambulatory Visit: Admitting: Gynecologic Oncology

## 2019-05-09 ENCOUNTER — Other Ambulatory Visit: Payer: Self-pay | Admitting: Gynecologic Oncology

## 2019-05-09 DIAGNOSIS — E8940 Asymptomatic postprocedural ovarian failure: Secondary | ICD-10-CM

## 2019-05-09 MED ORDER — ESTRADIOL 0.1 MG/24HR TD PTWK: 0.1000 mg | MEDICATED_PATCH | TRANSDERMAL | 12 refills | Status: DC

## 2019-05-09 NOTE — Telephone Encounter (Signed)
I spoke with Ms Judith Craig this am.  She is doing "great".  She is eating without difficulty and she is passing gas.  Pain is well controlled.  She is ambulating without diffiuclty.  No drainage from surgical site. I told patient that she will be on the estrogen patch untilmenapuse age.  I told her an rx was sent to the pharmacy for her and she is to change the patch every 7 days.  I also told her if she has difficulty with the patch adhering to her skin to please let us know.  I also told her the surgical pathology was benign.  I told her Dr. Harrington Challenger said it was an aggressive fibroid.  She verbalized understanding.

## 2019-05-09 NOTE — Telephone Encounter (Signed)
I told Judith Craig to remobe her honecomb dressing on 11/15.  I told her the incision did not need to be covered.  She can shower and clean the area with soap and water by gently padding the incision. She verbalized understanding.

## 2019-05-10 LAB — TYPE AND SCREEN
ABO/RH(D): A NEG
Antibody Screen: NEGATIVE
Unit division: 0
Unit division: 0
Unit division: 0
Unit division: 0
Unit division: 0
Unit division: 0

## 2019-05-10 LAB — BPAM RBC
Blood Product Expiration Date: 202011272359
Blood Product Expiration Date: 202011282359
Blood Product Expiration Date: 202011292359
Blood Product Expiration Date: 202012072359
Blood Product Expiration Date: 202012082359
Blood Product Expiration Date: 202012132359
ISSUE DATE / TIME: 202011100800
ISSUE DATE / TIME: 202011100800
ISSUE DATE / TIME: 202011100931
Unit Type and Rh: 600
Unit Type and Rh: 600
Unit Type and Rh: 600
Unit Type and Rh: 600
Unit Type and Rh: 600
Unit Type and Rh: 600

## 2019-05-10 LAB — PREPARE FRESH FROZEN PLASMA
Unit division: 0
Unit division: 0

## 2019-05-10 LAB — BPAM FFP
Blood Product Expiration Date: 202011132359
Blood Product Expiration Date: 202011132359
Unit Type and Rh: 6200
Unit Type and Rh: 6200

## 2019-05-16 ENCOUNTER — Telehealth: Payer: Self-pay | Admitting: *Deleted

## 2019-05-16 NOTE — Telephone Encounter (Signed)
error 

## 2019-05-21 ENCOUNTER — Other Ambulatory Visit: Payer: Self-pay

## 2019-05-21 ENCOUNTER — Inpatient Hospital Stay: Attending: Gynecologic Oncology | Admitting: Gynecologic Oncology

## 2019-05-21 ENCOUNTER — Encounter: Payer: Self-pay | Admitting: Gynecologic Oncology

## 2019-05-21 VITALS — BP 107/61 | HR 70 | Temp 98.0°F | Resp 18 | Ht 64.0 in | Wt 115.0 lb

## 2019-05-21 DIAGNOSIS — N83201 Unspecified ovarian cyst, right side: Secondary | ICD-10-CM

## 2019-05-21 DIAGNOSIS — Z79890 Hormone replacement therapy (postmenopausal): Secondary | ICD-10-CM

## 2019-05-21 DIAGNOSIS — Z90710 Acquired absence of both cervix and uterus: Secondary | ICD-10-CM | POA: Insufficient documentation

## 2019-05-21 DIAGNOSIS — B373 Candidiasis of vulva and vagina: Secondary | ICD-10-CM | POA: Diagnosis not present

## 2019-05-21 DIAGNOSIS — Z90722 Acquired absence of ovaries, bilateral: Secondary | ICD-10-CM | POA: Insufficient documentation

## 2019-05-21 DIAGNOSIS — E8940 Asymptomatic postprocedural ovarian failure: Secondary | ICD-10-CM | POA: Insufficient documentation

## 2019-05-21 DIAGNOSIS — D259 Leiomyoma of uterus, unspecified: Secondary | ICD-10-CM | POA: Diagnosis not present

## 2019-05-21 DIAGNOSIS — B3731 Acute candidiasis of vulva and vagina: Secondary | ICD-10-CM

## 2019-05-21 DIAGNOSIS — N83202 Unspecified ovarian cyst, left side: Secondary | ICD-10-CM

## 2019-05-21 MED ORDER — FLUCONAZOLE 100 MG PO TABS: 100.0000 mg | ORAL_TABLET | Freq: Once | ORAL | 0 refills | Status: AC

## 2019-05-21 MED ORDER — ESTRADIOL 0.1 MG/24HR TD PTTW: 1.0000 | MEDICATED_PATCH | TRANSDERMAL | 12 refills | Status: AC

## 2019-05-21 NOTE — Progress Notes (Signed)
Follow-up Note: Gyn-Onc  Consult was initially requested by Dr. Glo Herring for the evaluation of Judith Craig 28 y.o. female  CC:  Chief Complaint  Patient presents with  . Fibroids    Assessment/Plan:  [name]  is a 28 y.o.  year old with a history of a fibroid uterus and ascites, s/p TAH, BSO on November 10, [id number] complicated by hemorrhage (intraoperative).   Pathology was benign.  Recommendation is for routine gynecologic care (she does not require pap tests).  Hormone replacement therapy until at least age 4 (estrogen only).   HPI: [name] is a 28 year old P0 who is seen in consultation at the request of Dr. Glo Herring for evaluation of bilateral adnexal masses, large volume ascites, elevated CA-125.  The patient reported vague abdominal distention beginning in April 2020.  In September and October 2020 the distention became more visibly noticeable and she began having early satiety and difficulty having bowel movements with constipation.  She was seen and evaluated at the [facility], ED on October 23 and 24, 2020 at which time a CT scan of the abdomen and pelvis was performed which revealed large volume ascites, small right pleural effusion, normal liver and upper abdomen, or no lymphadenopathy.  The uterus was within normal limits.  There were bilateral heterogeneous enhancing masses, the left measuring 14.7 x 11.4 x 15.4 cm and the right measuring 11.6 x 9.8 x 11.8 cm.  There are a few additional nodular enhancing lesions within the right lower pelvis measuring 2.6 cm and 1.4 cm this was highly concerning for primary ovarian malignancy.  No frank omental caking was seen.  Ca1 25 was drawn on April 19, 2019 and was elevated at 957.  The patient reported that she is otherwise healthy and has never been hospitalized or had surgery.  She had never been pregnant.  She had regular menstrual cycles and prior to her presentation her last menstrual period was April 15, 2019.  She has no family history for malignancies gynecologic or otherwise.  The patient just graduated from tech school and works for [employer] and was on medical leave at the time of presentation.  Interval Hx:  She underwent paracentesis of the large volume ascites on 04/23/19 and this was benign.  She was taken to the OR on 05/06/19 for an exploratory laparotomy. At that time, findings were significant for 5L of ascites and a 30cm uterine mass (bilobed) infiltrative into the omentum with huge parasitic feeder vessels and anterior abdominal wall with massive peritoneal feeder vessels. 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 We proceded with a TAH, BSO. Ovaries could not be salvaged due to the configuration of the uterine masses at the fundus precluding sparing ovarian tissue and gonadal vessels in the setting of significant intraoperative bleeding. EBL was 2800cc for the procedure and she required blood transfusion.  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  The ovaries were benign bilaterally as were the fallopian tubes.  The cervix demonstrated no dysplasia or malignancy. No malignancy was identified.  She did well postoperatively meeting discharge criteria on POD 2. She was discharged to home with an estrogen replacement patch.   Current Meds:  Outpatient Encounter Medications as of 05/21/2019  Medication Sig  . [START ON 05/22/2019] estradiol (VIVELLE-DOT) 0.1 MG/24HR patch Place 1 patch (0.1 mg total) onto the skin 2 (two) times a week.  . ferrous gluconate (FERGON) 324 MG tablet Take 1 tablet (324  mg total) by mouth 2 (two) times daily with a meal.  . fluconazole (DIFLUCAN) 100 MG tablet Take 1 tablet (100 mg total) by mouth once for 1 dose.  . ibuprofen (ADVIL) 800 MG tablet Take 1 tablet (800 mg total) by mouth every 8 (eight) hours as needed for moderate pain. For AFTER surgery  . [DISCONTINUED]  estradiol (CLIMARA) 0.1 mg/24hr patch Place 1 patch (0.1 mg total) onto the skin once a week.  . [DISCONTINUED] oxyCODONE (OXY IR/ROXICODONE) 5 MG immediate release tablet Take 1 tablet (5 mg total) by mouth every 4 (four) hours as needed for severe pain. For AFTER surgery, do not take and drive  . [DISCONTINUED] Probiotic Product (PROBIOTIC PO) Take 1 capsule by mouth daily.  . [DISCONTINUED] senna-docusate (SENOKOT-S) 8.6-50 MG tablet Take 2 tablets by mouth at bedtime. For AFTER surgery, do not take if having diarrhea   No facility-administered encounter medications on file as of 05/21/2019.     Allergy: No Known Allergies  Social Hx:   Social History   Socioeconomic History  . Marital status: Single    Spouse name: Not on file  . Number of children: Not on file  . Years of education: Not on file  . Highest education level: Not on file  Occupational History  . Occupation: TEACHER  Social Needs  . Financial resource strain: Not on file  . Food insecurity    Worry: Not on file    Inability: Not on file  . Transportation needs    Medical: Not on file    Non-medical: Not on file  Tobacco Use  . Smoking status: Never Smoker  . Smokeless tobacco: Never Used  Substance and Sexual Activity  . Alcohol use: No    Alcohol/week: 0.0 standard drinks    Comment: social  . Drug use: No  . Sexual activity: Yes    Birth control/protection: None  Lifestyle  . Physical activity    Days per week: Not on file    Minutes per session: Not on file  . Stress: Not on file  Relationships  . Social Herbalist on phone: Not on file    Gets together: Not on file    Attends religious service: Not on file    Active member of club or organization: Not on file    Attends meetings of clubs or organizations: Not on file    Relationship status: Not on file  . Intimate partner violence    Fear of current or ex partner: Not on file    Emotionally abused: Not on file    Physically abused:  Not on file    Forced sexual activity: Not on file  Other Topics Concern  . Not on file  Social History Narrative  . Not on file    Past Surgical Hx:  Past Surgical History:  Procedure Laterality Date  . ABDOMINAL HYSTERECTOMY Bilateral 05/06/2019   Procedure: OPEN TOTAL HYSTERECTOMY ABDOMINAL,BILATERAL SALPINGO OOPHORECTOMY,;  Surgeon: Everitt Amber, MD;  Location: [facility];  Service: Gynecology;  Laterality: Bilateral;  . NO PAST SURGERIES    . OMENTECTOMY N/A 05/06/2019   Procedure: OMENTECTOMY;  Surgeon: Everitt Amber, MD;  Location: [facility];  Service: Gynecology;  Laterality: N/A;    Past Medical Hx:  Past Medical History:  Diagnosis Date  . Medical history non-contributory     Past Gynecological History:  See HPI, no hx of abnormal paps, nulliparous.  No LMP recorded.  Family Hx:  Family History  Problem Relation Age of Onset  . Diabetes Paternal Grandmother   . Hypertension Other   . Diabetes Other     Review of Systems:  Constitutional  Feels somewhat fatigued  ENT Normal appearing ears and nares bilaterally Skin/Breast  No rash, sores, jaundice, itching, dryness Cardiovascular  No chest pain, shortness of breath, or edema  Pulmonary  No cough or wheeze.  Gastro Intestinal  No nausea, vomitting, or diarrhoea. No bright red blood per rectum, no abdominal pain, change in bowel movement, + constipation. + distension.  Genito Urinary  No frequency, urgency, dysuria,  Musculo Skeletal  No myalgia, arthralgia, joint swelling or pain  Neurologic  No weakness, numbness, change in gait,  Psychology  No depression, anxiety, insomnia.   Vitals:  Blood pressure 107/61, pulse 70, temperature 98 F (36.7 C), temperature source Temporal, resp. rate 18, height 5\' 4"  (1.626 m), weight 115 lb (52.2 kg), SpO2 98 %.  Physical Exam: WD in NAD Neck  Supple NROM, without any enlargements.  Lymph Node Survey No cervical supraclavicular or inguinal adenopathy Cardiovascular   Pulse normal rate, regularity and rhythm. S1 and S2 normal.  Lungs  Clear to auscultation bilateraly, without wheezes/crackles/rhonchi. Good air movement.  Skin  No rash/lesions/breakdown  Psychiatry  Alert and oriented to person, place, and time  Abdomen  Normoactive bowel sounds, abdomen soft, non-tender. Incision well healed Back No CVA tenderness Genito Urinary  Vaginal cuff: in tact, no bleeding or lesions.  Rectal  Good tone, no masses no cul de sac nodularity.  Extremities  No bilateral cyanosis, clubbing or edema.   Thereasa Solo, MD  05/21/2019, 11:12 AM

## 2019-05-21 NOTE — Patient Instructions (Signed)
The uterus showed fibroids but no cancer. No specific follow-up is necessary for this. You do not need pap smears. You should continue to take the estrogen patch (or tablet) until age 28.   You should remain off of work until December 28th.  For your yeast infection you can try a diflucan tablet and monistat cream.   Avoid intercourse for another month. Avoid tub baths until the dermabond glue has come off of the skin.

## 2019-06-06 ENCOUNTER — Telehealth: Payer: Self-pay | Admitting: *Deleted

## 2019-06-06 NOTE — Telephone Encounter (Signed)
Faxed office the office note for 11/25 per request from [employer]

## 2020-06-09 ENCOUNTER — Ambulatory Visit: Payer: Self-pay

## 2020-08-13 IMAGING — CR DG ABDOMEN ACUTE W/ 1V CHEST
4 series · 4 of 4 positions shown · non-contrast
Comparison: Abdominal radiograph dated 08/12/2018

CLINICAL DATA: 28-year-old female with abdominal distension.

EXAM:
DG ABDOMEN ACUTE W/ 1V CHEST

[chest pa]
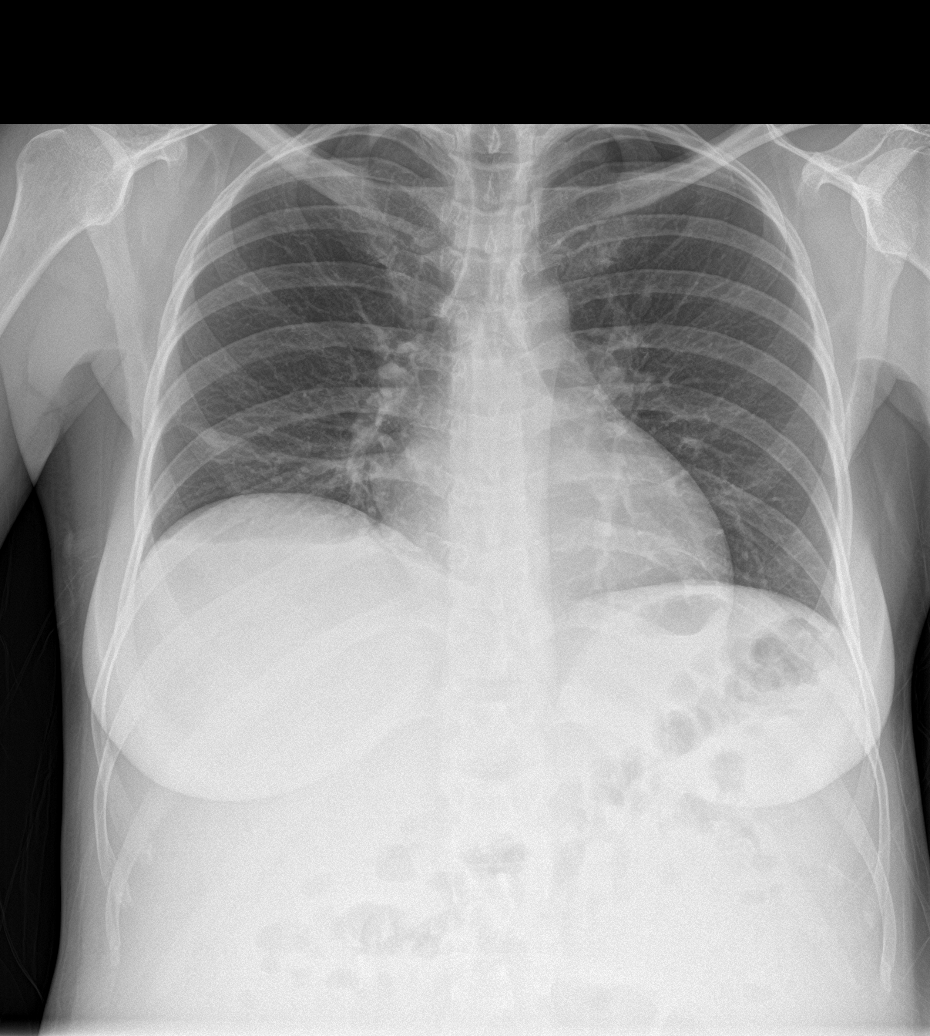

[abdomen erect]
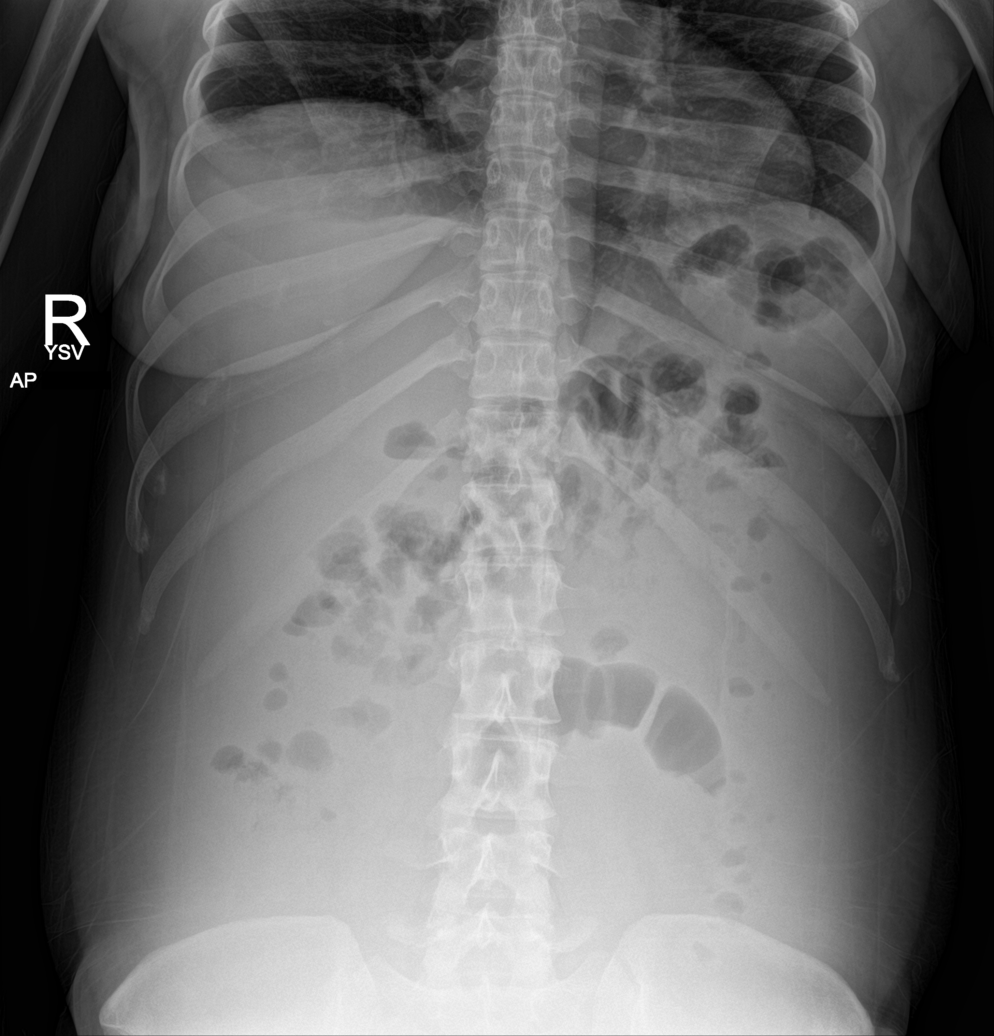

[abdomen supine (1 of 2)]
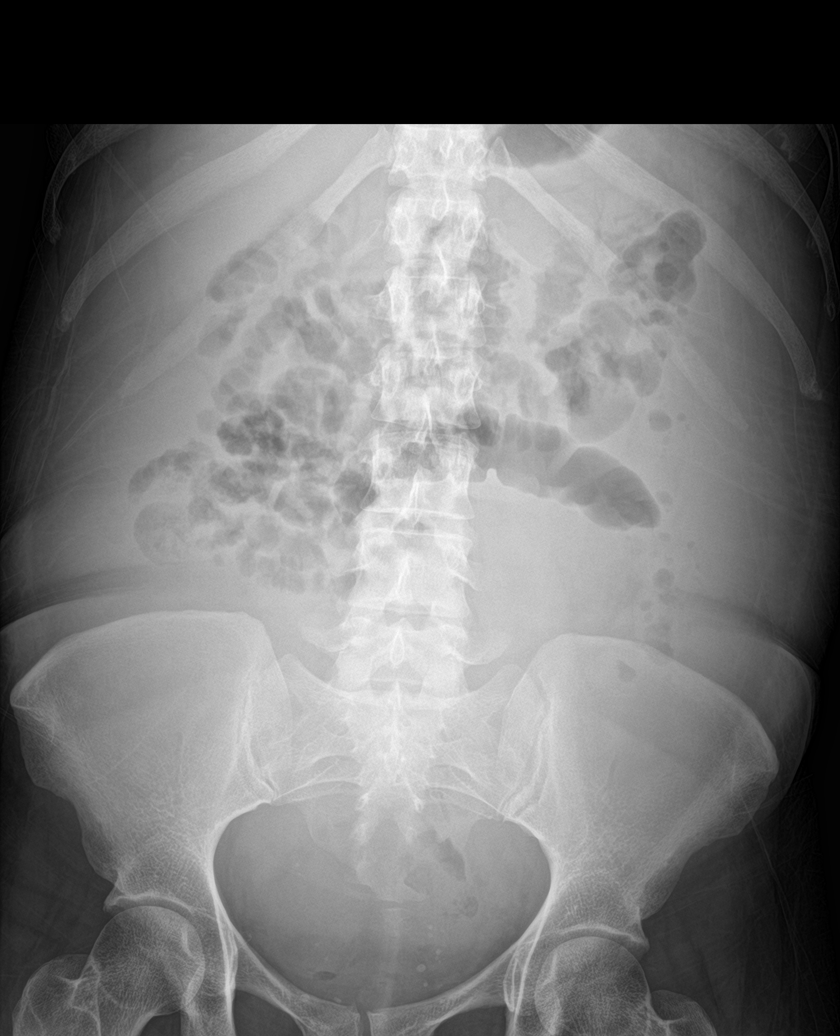

[abdomen supine (2 of 2)]
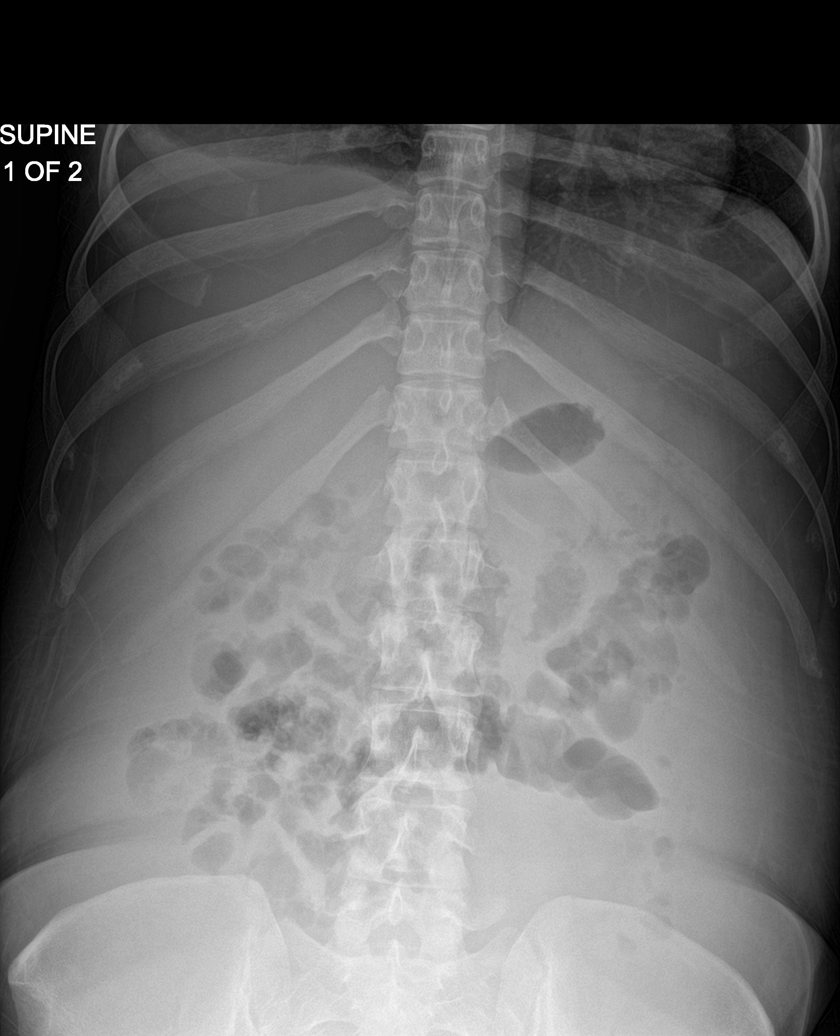

[4 of 4 positions shown; findings below may reference images not displayed]

FINDINGS: Minimal right lung base linear atelectasis. No focal consolidation,
pleural effusion, or pneumothorax. The cardiac silhouette is within
normal limits.

Nonspecific bowel gas pattern. No bowel dilatation or evidence of
obstruction. Air is noted in the sigmoid colon. There is centrally
located small bowel air. Ascites is not excluded. No free air
identified. The osseous structures and soft tissues are grossly
unremarkable.
IMPRESSION: 1. No acute cardiopulmonary process.
2. No evidence of bowel obstruction. No free air.
